# Patient Record
Sex: Female | Born: 1985 | Race: Black or African American | Hispanic: No | Marital: Single | Smoking: Current every day smoker
Health system: Southern US, Community
[De-identification: ages and names within clinical notes are randomized; demographics above are authoritative.]

## PROBLEM LIST (undated history)

## (undated) DIAGNOSIS — F1010 Alcohol abuse, uncomplicated: Secondary | ICD-10-CM

## (undated) HISTORY — PX: NO PAST SURGERIES: SHX2092

---

## 2010-08-15 ENCOUNTER — Encounter: Admission: RE | Admit: 2010-08-15 | Discharge: 2010-08-15 | Payer: Self-pay | Admitting: Infectious Diseases

## 2011-09-10 ENCOUNTER — Emergency Department (HOSPITAL_COMMUNITY): Payer: Self-pay

## 2011-09-10 ENCOUNTER — Emergency Department (HOSPITAL_COMMUNITY)
Admission: EM | Admit: 2011-09-10 | Discharge: 2011-09-10 | Disposition: A | Discharge status: Home / Self Care | Payer: Self-pay | Attending: Emergency Medicine | Admitting: Emergency Medicine

## 2011-09-10 DIAGNOSIS — R059 Cough, unspecified: Secondary | ICD-10-CM | POA: Insufficient documentation

## 2011-09-10 DIAGNOSIS — J4489 Other specified chronic obstructive pulmonary disease: Secondary | ICD-10-CM | POA: Insufficient documentation

## 2011-09-10 DIAGNOSIS — J449 Chronic obstructive pulmonary disease, unspecified: Secondary | ICD-10-CM | POA: Insufficient documentation

## 2011-09-10 DIAGNOSIS — R079 Chest pain, unspecified: Secondary | ICD-10-CM | POA: Insufficient documentation

## 2011-09-10 DIAGNOSIS — R0989 Other specified symptoms and signs involving the circulatory and respiratory systems: Secondary | ICD-10-CM | POA: Insufficient documentation

## 2011-09-10 DIAGNOSIS — R509 Fever, unspecified: Secondary | ICD-10-CM | POA: Insufficient documentation

## 2011-09-10 DIAGNOSIS — R0609 Other forms of dyspnea: Secondary | ICD-10-CM | POA: Insufficient documentation

## 2011-09-10 DIAGNOSIS — R05 Cough: Secondary | ICD-10-CM | POA: Insufficient documentation

## 2011-11-15 ENCOUNTER — Encounter: Payer: Self-pay | Admitting: Emergency Medicine

## 2011-11-15 ENCOUNTER — Emergency Department (HOSPITAL_COMMUNITY)
Admission: EM | Admit: 2011-11-15 | Discharge: 2011-11-16 | Disposition: A | Discharge status: Home / Self Care | Payer: Self-pay | Attending: Emergency Medicine | Admitting: Emergency Medicine

## 2011-11-15 ENCOUNTER — Emergency Department (HOSPITAL_COMMUNITY): Payer: Self-pay

## 2011-11-15 DIAGNOSIS — F17200 Nicotine dependence, unspecified, uncomplicated: Secondary | ICD-10-CM | POA: Insufficient documentation

## 2011-11-15 DIAGNOSIS — X58XXXA Exposure to other specified factors, initial encounter: Secondary | ICD-10-CM | POA: Insufficient documentation

## 2011-11-15 DIAGNOSIS — R42 Dizziness and giddiness: Secondary | ICD-10-CM | POA: Insufficient documentation

## 2011-11-15 DIAGNOSIS — S29011A Strain of muscle and tendon of front wall of thorax, initial encounter: Secondary | ICD-10-CM

## 2011-11-15 DIAGNOSIS — IMO0002 Reserved for concepts with insufficient information to code with codable children: Secondary | ICD-10-CM | POA: Insufficient documentation

## 2011-11-15 DIAGNOSIS — R079 Chest pain, unspecified: Secondary | ICD-10-CM | POA: Insufficient documentation

## 2011-11-15 NOTE — ED Notes (Signed)
Patient transported to X-ray 

## 2011-11-15 NOTE — ED Notes (Signed)
PT. REPORTS RIGHT ANTERIOR RIBCAGE PAIN WORSE WHEN COUGHING AND DEEP INSPIRATION  ONSET 2 DAYS AGO ,  DENIES FEVER OR CHILLS ,  NASAL CONGESTION .

## 2011-11-16 MED ORDER — IBUPROFEN 800 MG PO TABS
800.0000 mg | ORAL_TABLET | Freq: Once | ORAL | Status: AC
  Administered 2011-11-16: 800 mg via ORAL
  Filled 2011-11-16: qty 1

## 2011-11-16 MED ORDER — IBUPROFEN 600 MG PO TABS: 600.0000 mg | ORAL_TABLET | Freq: Three times a day (TID) | ORAL | Status: AC | PRN

## 2011-11-16 NOTE — ED Provider Notes (Signed)
History     CSN: [id number] Arrival date & time: 11/15/2011 11:14 PM   First MD Initiated Contact with Patient 11/16/11 0023      Chief Complaint  Patient presents with  . Chest Pain    (Consider location/radiation/quality/duration/timing/severity/associated sxs/prior treatment) HPI Comments: Patient reports right lower rib pain for the past 24 hours.  Does not recall any particular injury.  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  Does not have children in the home.  Has not been in an MVC sustained a large bearhug fallen down.  Travels.  Denies lower extremity swelling DVT risk factors.  Has not taken any over-the-counter medication to alleviate pain when she is lying still and breathing shallow.  Is not experiencing any pain when she takes a deep breath or coughs.  Pain is increased  Patient is a 25 y.o. female presenting with chest pain. The history is provided by the patient.  Chest Pain The chest pain began yesterday. Chest pain occurs constantly. The chest pain is unchanged. The pain is associated with coughing, breathing and exertion. At its most intense, the pain is at 4/10. The pain is currently at 3/10. The severity of the pain is mild. The quality of the pain is described as dull. The pain does not radiate. Chest pain is worsened by certain positions and deep breathing. Primary symptoms include dizziness. Pertinent negatives for primary symptoms include no fever, no shortness of breath, no cough, no wheezing, no abdominal pain, no nausea and no vomiting.  Dizziness does not occur with nausea, vomiting or weakness.   Pertinent negatives for associated symptoms include no weakness. She tried nothing for the symptoms. Risk factors include no known risk factors.     History reviewed. No pertinent past medical history.  History reviewed. No pertinent past surgical history.  No family history on  file.  History  Substance Use Topics  . Smoking status: Current Everyday Smoker  . Smokeless tobacco: Not on file  . Alcohol Use: Yes     ETOH THIS EVENING    OB History    Grav Para Term Preterm Abortions TAB SAB Ect Mult Living                  Review of Systems  Constitutional: Negative for fever.  HENT: Negative for congestion and rhinorrhea.   Eyes: Negative.   Respiratory: Negative for cough, shortness of breath and wheezing.   Cardiovascular: Positive for chest pain.  Gastrointestinal: Negative.  Negative for nausea, vomiting and abdominal pain.  Genitourinary: Negative for dysuria and frequency.  Musculoskeletal: Negative for myalgias and back pain.  Skin: Negative.  Negative for rash.  Neurological: Positive for dizziness. Negative for weakness.  Hematological: Negative.   Psychiatric/Behavioral: Negative.     Allergies  Review of patient's allergies indicates no known allergies.  Home Medications   Current Outpatient Rx  Name Route Sig Dispense Refill  . IBUPROFEN 600 MG PO TABS Oral Take 1 tablet (600 mg total) by mouth every 8 (eight) hours as needed for pain. 60 tablet 0    BP 117/82  Pulse 78  Temp(Src) 98.2 F (36.8 C) (Oral)  Resp 14  SpO2 100%  LMP 10/25/2011  Physical Exam  Constitutional: She is oriented to person, place, and time. She appears well-developed.  HENT:  Head: Normocephalic.  Eyes: Pupils are equal, round, and reactive to light.  Neck: Normal range  of motion.  Cardiovascular: Normal rate.   Pulmonary/Chest: Effort normal. She has no wheezes. She has no rales. She exhibits tenderness.       With deep palpitation lateral R chest wall tenderness  Abdominal: Soft.  Musculoskeletal: Normal range of motion.  Neurological: She is oriented to person, place, and time.  Skin: Skin is warm and dry.  Psychiatric: She has a normal mood and affect.    ED Course  Procedures (including critical care time)  Labs Reviewed - No data to  display Dg Chest 2 View  11/15/2011  *RADIOLOGY REPORT*  Clinical Data: Anterior right rib pain, just below the breast.  CHEST - 2 VIEW  Comparison: Chest radiograph performed 09/10/2011  Findings: No displaced rib fractures are seen.  The lungs are well-aerated and clear.  There is no evidence of focal opacification, pleural effusion or pneumothorax.  The heart is normal in size; the mediastinal contour is within normal limits.  No acute osseous abnormalities are seen.  IMPRESSION: No displaced rib fractures seen; no acute cardiopulmonary process identified.  Original Report Authenticated By: Tonia Ghent, M.D.     1. Chest wall muscle strain       MDM  With negative history for DVT risk factors denies cough, fever, trauma to the chest wall.  This is most likely a muscle strain.  We'll treat with ibuprofen on a regular basis for the next 2-3 weeks.  Informed patient if she starts running a fever and shortness of breath, return to the emergency room for further evaluation.  At this time.  Her chest x-ray is normal        Arman Filter, NP 11/16/11 0048  Arman Filter, NP 11/16/11 [phone]

## 2011-11-16 NOTE — ED Provider Notes (Signed)
Medical screening examination/treatment/procedure(s) were performed by non-physician practitioner and as supervising physician I was immediately available for consultation/collaboration.  Hurman Horn, MD 11/16/11 [phone]

## 2012-03-10 ENCOUNTER — Encounter (HOSPITAL_COMMUNITY): Payer: Self-pay

## 2012-03-10 ENCOUNTER — Emergency Department (INDEPENDENT_AMBULATORY_CARE_PROVIDER_SITE_OTHER)
Admission: EM | Admit: 2012-03-10 | Discharge: 2012-03-10 | Disposition: A | Discharge status: Home / Self Care | Payer: Self-pay | Source: Home / Self Care | Attending: Family Medicine | Admitting: Family Medicine

## 2012-03-10 DIAGNOSIS — J302 Other seasonal allergic rhinitis: Secondary | ICD-10-CM

## 2012-03-10 DIAGNOSIS — J309 Allergic rhinitis, unspecified: Secondary | ICD-10-CM

## 2012-03-10 MED ORDER — CETIRIZINE HCL 10 MG PO TABS: 10.0000 mg | ORAL_TABLET | Freq: Every day | ORAL | Status: DC

## 2012-03-10 MED ORDER — FLUTICASONE PROPIONATE 50 MCG/ACT NA SUSP: 2.0000 | Freq: Every day | NASAL | Status: DC

## 2012-03-10 NOTE — ED Provider Notes (Signed)
History     CSN: [id number]  Arrival date & time 03/10/12  1329   First MD Initiated Contact with Patient 03/10/12 1431      Chief Complaint  Patient presents with  . Influenza    (Consider location/radiation/quality/duration/timing/severity/associated sxs/prior treatment) Patient is a 26 y.o. female presenting with URI. The history is provided by the patient.  URI The primary symptoms include sore throat and cough. Primary symptoms do not include fever, wheezing, abdominal pain, nausea or vomiting. The current episode started yesterday. This is a new problem. The problem has not changed since onset. Symptoms associated with the illness include sinus pressure, congestion and rhinorrhea.    History reviewed. No pertinent past medical history.  History reviewed. No pertinent past surgical history.  History reviewed. No pertinent family history.  History  Substance Use Topics  . Smoking status: Current Everyday Smoker  . Smokeless tobacco: Not on file  . Alcohol Use: Yes     ETOH THIS EVENING    OB History    Grav Para Term Preterm Abortions TAB SAB Ect Mult Living                  Review of Systems  Constitutional: Negative.  Negative for fever.  HENT: Positive for congestion, sore throat, rhinorrhea, postnasal drip and sinus pressure.   Respiratory: Positive for cough. Negative for wheezing.   Gastrointestinal: Negative for nausea, vomiting and abdominal pain.    Allergies  Review of patient's allergies indicates no known allergies.  Home Medications   Current Outpatient Rx  Name Route Sig Dispense Refill  . CETIRIZINE HCL 10 MG PO TABS Oral Take 1 tablet (10 mg total) by mouth daily. One tab daily for allergies 30 tablet 1  . FLUTICASONE PROPIONATE 50 MCG/ACT NA SUSP Nasal Place 2 sprays into the nose daily. 1 g 2    BP 122/90  Pulse 86  Temp(Src) 99.2 F (37.3 C) (Oral)  Resp 14  SpO2 99%  LMP 02/28/2012  Physical Exam  Constitutional: She is  oriented to person, place, and time. She appears well-developed and well-nourished.  HENT:  Head: Normocephalic.  Right Ear: External ear normal.  Left Ear: External ear normal.  Nose: Mucosal edema and rhinorrhea present.  Mouth/Throat: Oropharynx is clear and moist.  Eyes: Conjunctivae are normal. Pupils are equal, round, and reactive to light.  Neck: Normal range of motion. Neck supple.  Cardiovascular: Normal rate, regular rhythm, normal heart sounds and intact distal pulses.   Pulmonary/Chest: Effort normal and breath sounds normal.  Lymphadenopathy:    She has no cervical adenopathy.  Neurological: She is alert and oriented to person, place, and time.  Skin: Skin is warm and dry.    ED Course  Procedures (including critical care time)  Labs Reviewed - No data to display No results found.   1. Seasonal allergic rhinitis       MDM         Linna Hoff, MD 03/10/12 [phone]

## 2012-03-10 NOTE — ED Notes (Signed)
C/o general body aches, cough, congestion, ear pain since Friday; taking no medicines for her symptoms

## 2013-12-13 ENCOUNTER — Emergency Department (HOSPITAL_COMMUNITY)
Admission: EM | Admit: 2013-12-13 | Discharge: 2013-12-13 | Disposition: A | Discharge status: Home / Self Care | Payer: Self-pay | Attending: Emergency Medicine | Admitting: Emergency Medicine

## 2013-12-13 ENCOUNTER — Encounter (HOSPITAL_COMMUNITY): Payer: Self-pay | Admitting: Emergency Medicine

## 2013-12-13 DIAGNOSIS — Z79899 Other long term (current) drug therapy: Secondary | ICD-10-CM | POA: Insufficient documentation

## 2013-12-13 DIAGNOSIS — F17200 Nicotine dependence, unspecified, uncomplicated: Secondary | ICD-10-CM | POA: Insufficient documentation

## 2013-12-13 DIAGNOSIS — H01003 Unspecified blepharitis right eye, unspecified eyelid: Secondary | ICD-10-CM

## 2013-12-13 DIAGNOSIS — IMO0002 Reserved for concepts with insufficient information to code with codable children: Secondary | ICD-10-CM | POA: Insufficient documentation

## 2013-12-13 DIAGNOSIS — H01009 Unspecified blepharitis unspecified eye, unspecified eyelid: Secondary | ICD-10-CM | POA: Insufficient documentation

## 2013-12-13 MED ORDER — BACITRACIN-POLYMYXIN B 500-10000 UNIT/GM OP OINT: 1.0000 "application " | TOPICAL_OINTMENT | OPHTHALMIC | Status: DC

## 2013-12-13 NOTE — Discharge Instructions (Signed)
Read the information below.  Use the prescribed medication as directed.  Please discuss all new medications with your pharmacist.  You may return to the Emergency Department at any time for worsening condition or any new symptoms that concern you.  If you have a change in vision, difficulty moving your eye, fevers, worsening discharge, see the eye doctor or return to the ER immediately.    Blepharitis Blepharitis is redness, soreness, and swelling (inflammation) of one or both eyelids. It may be caused by an allergic reaction or a bacterial infection. Blepharitis may also be associated with reddened, scaly skin (seborrhea) of the scalp and eyebrows. While you sleep, eye discharge may cause your eyelashes to stick together. Your eyelids may itch, burn, swell, and may lose their lashes. These will grow back. Your eyes may become sensitive. Blepharitis may recur and need repeated treatment. If this is the case, you may require further evaluation by an eye specialist (ophthalmologist). HOME CARE INSTRUCTIONS   Keep your hands clean.  Use a clean towel each time you dry your eyelids. Do not use this towel to clean other areas. Do not share a towel or makeup with anyone.  Wash your eyelids with warm water or warm water mixed with a small amount of baby shampoo. Do this twice a day or as often as needed.  Wash your face and eyebrows at least once a day.  Use warm compresses 2 times a day for 10 minutes at a time, or as directed by your caregiver.  Apply antibiotic ointment as directed by your caregiver.  Avoid rubbing your eyes.  Avoid wearing makeup until you get better.  Follow up with your caregiver as directed. SEEK IMMEDIATE MEDICAL CARE IF:   You have pain, redness, or swelling that gets worse or spreads to other parts of your face.  Your vision changes, or you have pain when looking at lights or moving objects.  You have a fever.  Your symptoms continue for longer than 2 to 4 days or  become worse. MAKE SURE YOU:   Understand these instructions.  Will watch your condition.  Will get help right away if you are not doing well or get worse. Document Released: 11/23/2000 Document Revised: 02/18/2012 Document Reviewed: 01/03/2011 [facility] Patient Information 2014 [place] [place].

## 2013-12-13 NOTE — ED Provider Notes (Signed)
CSN: [id number]     Arrival date & time 12/13/13  1347 History  This chart was scribed for Trixie Dredge, PA working with Doug Sou, MD by Quintella Reichert, ED Scribe. This patient was seen in room TR09C/TR09C and the patient's care was started at 3:27 PM.   Chief Complaint  Patient presents with  . Eye Pain    The history is provided by the patient. No language interpreter was used.    HPI Comments: Dana Matthews is a 28 y.o. female who presents to the Emergency Department complaining of constant, progressively-worsening, mild-to-moderate right eye pain that began on waking yesterday.  Pt also states that this morning her right eyelids were stuck shut with "crusty stuff."  She also states that the area feels itchy.  She denies pain on moving her eyes.  She denies visual changes, sore throat, cough, congestion, or any other associated symptoms.  She denies foreign bodies or injuries to her knowledge.  She admits to recent sick contact.     History reviewed. No pertinent past medical history.  History reviewed. No pertinent past surgical history.  History reviewed. No pertinent family history.   History  Substance Use Topics  . Smoking status: Current Every Day Smoker    Types: Cigarettes  . Smokeless tobacco: Not on file  . Alcohol Use: Yes    OB History   Grav Para Term Preterm Abortions TAB SAB Ect Mult Living                  Review of Systems  HENT: Negative for congestion, rhinorrhea, sinus pressure and sore throat.   Eyes: Positive for pain (eyelid), discharge and itching. Negative for visual disturbance.  Respiratory: Negative for cough.      Allergies  Review of patient's allergies indicates no known allergies.  Home Medications   Current Outpatient Rx  Name  Route  Sig  Dispense  Refill  . EXPIRED: cetirizine (ZYRTEC) 10 MG tablet   Oral   Take 1 tablet (10 mg total) by mouth daily. One tab daily for allergies   30 tablet   1   . EXPIRED:  fluticasone (FLONASE) 50 MCG/ACT nasal spray   Nasal   Place 2 sprays into the nose daily.   1 g   2    BP 110/74  Pulse 64  Temp(Src) 97.6 F (36.4 C) (Oral)  Resp 16  Wt 165 lb 8 oz (75.07 kg)  SpO2 100%  Physical Exam  Nursing note and vitals reviewed. Constitutional: She appears well-developed and well-nourished. No distress.  HENT:  Head: Normocephalic and atraumatic.  Mouth/Throat: Oropharynx is clear and moist. No oropharyngeal exudate, posterior oropharyngeal edema or posterior oropharyngeal erythema.  Frontal and maxillary sinuses nontender. No lymphadenopathy of head or neck.  Eyes: Conjunctivae and EOM are normal. Right conjunctiva is not injected. Right conjunctiva has no hemorrhage. Left conjunctiva is not injected. Left conjunctiva has no hemorrhage. No scleral icterus.  Right eyelid edematous and mildly erythematous.  Tender to palpation.  EOMs intact and without pain.  Conjunctivae and sclera clear.    Neck: Neck supple.  Pulmonary/Chest: Effort normal.  Lymphadenopathy:    She has no cervical adenopathy.  Neurological: She is alert.  Skin: She is not diaphoretic.    ED Course  Procedures (including critical care time)  DIAGNOSTIC STUDIES: Oxygen Saturation is 100% on room air, normal by my interpretation.    COORDINATION OF CARE: 3:32 PM-Discussed treatment plan which includes antibiotics with pt at bedside  and pt agreed to plan.    Labs Review Labs Reviewed - No data to display  Imaging Review No results found.  EKG Interpretation   None       MDM   1. Blepharitis, right    Patient with itching/ tenderness of right eye with discharge and matting of eyelids x 2 days.  She has mild edema and mild tenderness of right eyelid with a small area that is pink - this does not appears to be a preseptal cellulitis.  She has EOM intact without any pain.  Her right eye is not injected - does not have the appearance of conjunctivitis.  She has had no  trauma.  Doubt corneal abrasion or ulcer.  Likely mild right blepharitis.  D/C home with eye ointment.  Ophthalmology referral for worsening symptoms.  Discussed findings, treatment, and follow up  with patient.  Pt given return precautions.  Pt verbalizes understanding and agrees with plan.      I doubt any other EMC precluding discharge at this time including, but not necessarily limited to the following: orbital or periorbital cellulitis, corneal abrasion    I personally performed the services described in this documentation, which was scribed in my presence. The recorded information has been reviewed and is accurate.    HermanEmily Rae Plotner, PA-C 12/13/13 1654

## 2013-12-13 NOTE — ED Notes (Signed)
Pt states yesterday she woke with pain to R eye. Today she woke with more pain and drainage. States there was so much drainage she was unable to open her eyelids when she first woke up

## 2013-12-14 NOTE — ED Provider Notes (Signed)
Medical screening examination/treatment/procedure(s) were performed by non-physician practitioner and as supervising physician I was immediately available for consultation/collaboration.  EKG Interpretation   None        Doug SouSam Harriett Azar, MD 12/14/13 0028

## 2014-06-04 ENCOUNTER — Emergency Department (HOSPITAL_COMMUNITY): Payer: Worker's Compensation

## 2014-06-04 ENCOUNTER — Encounter (HOSPITAL_COMMUNITY): Payer: Self-pay | Admitting: Emergency Medicine

## 2014-06-04 ENCOUNTER — Emergency Department (HOSPITAL_COMMUNITY)
Admission: EM | Admit: 2014-06-04 | Discharge: 2014-06-04 | Disposition: A | Discharge status: Home / Self Care | Payer: Worker's Compensation | Attending: Emergency Medicine | Admitting: Emergency Medicine

## 2014-06-04 DIAGNOSIS — W268XXA Contact with other sharp object(s), not elsewhere classified, initial encounter: Secondary | ICD-10-CM | POA: Insufficient documentation

## 2014-06-04 DIAGNOSIS — S61209A Unspecified open wound of unspecified finger without damage to nail, initial encounter: Secondary | ICD-10-CM | POA: Insufficient documentation

## 2014-06-04 DIAGNOSIS — Y9289 Other specified places as the place of occurrence of the external cause: Secondary | ICD-10-CM | POA: Insufficient documentation

## 2014-06-04 DIAGNOSIS — S61219A Laceration without foreign body of unspecified finger without damage to nail, initial encounter: Secondary | ICD-10-CM

## 2014-06-04 DIAGNOSIS — Z7982 Long term (current) use of aspirin: Secondary | ICD-10-CM | POA: Insufficient documentation

## 2014-06-04 DIAGNOSIS — Y990 Civilian activity done for income or pay: Secondary | ICD-10-CM | POA: Insufficient documentation

## 2014-06-04 DIAGNOSIS — Y93G1 Activity, food preparation and clean up: Secondary | ICD-10-CM | POA: Insufficient documentation

## 2014-06-04 DIAGNOSIS — Z23 Encounter for immunization: Secondary | ICD-10-CM | POA: Insufficient documentation

## 2014-06-04 DIAGNOSIS — F17200 Nicotine dependence, unspecified, uncomplicated: Secondary | ICD-10-CM | POA: Insufficient documentation

## 2014-06-04 MED ORDER — CEPHALEXIN 500 MG PO CAPS: 500.0000 mg | ORAL_CAPSULE | Freq: Four times a day (QID) | ORAL | Status: DC

## 2014-06-04 MED ORDER — TETANUS-DIPHTH-ACELL PERTUSSIS 5-2.5-18.5 LF-MCG/0.5 IM SUSP
0.5000 mL | Freq: Once | INTRAMUSCULAR | Status: AC
Start: 2014-06-04 — End: 2014-06-04
  Administered 2014-06-04: 0.5 mL via INTRAMUSCULAR
  Filled 2014-06-04: qty 0.5

## 2014-06-04 MED ORDER — CEPHALEXIN 500 MG PO CAPS
500.0000 mg | ORAL_CAPSULE | Freq: Once | ORAL | Status: AC
  Administered 2014-06-04: 500 mg via ORAL
  Filled 2014-06-04: qty 1

## 2014-06-04 MED ORDER — TRAMADOL HCL 50 MG PO TABS
50.0000 mg | ORAL_TABLET | Freq: Once | ORAL | Status: AC
  Administered 2014-06-04: 50 mg via ORAL
  Filled 2014-06-04: qty 1

## 2014-06-04 MED ORDER — TRAMADOL HCL 50 MG PO TABS: 50.0000 mg | ORAL_TABLET | Freq: Four times a day (QID) | ORAL | Status: DC | PRN

## 2014-06-04 NOTE — Progress Notes (Signed)
[facility] provided pt with a list of primary care resources to help patient establish a pcp.  °

## 2014-06-04 NOTE — Discharge Instructions (Signed)
If you see signs of infection (warmth, redness, tenderness, pus, sharp increase in pain, fever, red streaking) immediately return to the emergency department.  Take your antibiotics as directed and to completion. You should never have any leftover antibiotics! Push fluids and stay well hydrated.   Please follow with your primary care doctor in the next 2 days for a check-up. They must obtain records for further management.   Do not hesitate to return to the Emergency Department for any new, worsening or concerning symptoms.

## 2014-06-04 NOTE — ED Notes (Signed)
Pt reports cutting her left middle finger on a piece of glass while at work. Bleeding is controlled at this time, vitals are WDL, and pt is in NAD.

## 2014-06-04 NOTE — ED Provider Notes (Signed)
CSN: [id number]     Arrival date & time 06/04/14  1554 History  This chart was scribed for Dana EmeryNicole Moriah Shawley, PA, working with Regan Lemmingob Beaton, MD, by Ardelia Memsylan Malpass ED Scribe. This patient was seen in room WTR7/WTR7 and the patient's care was started at 4:06 PM.   Chief Complaint  Patient presents with  . Laceration    The history is provided by the patient. No language interpreter was used.    HPI Comments: Dana Matthews is a right-hand dominant 28 y.o. female who presents to the Emergency Department complaining of laceration to the distal, palmar aspect of her left middle finger sustained earlier today. She states that she was cleaning and accidentally cut the finger on some broken glass. She is complaining of constant, moderate pain to the finger. Bleeding is controlled. She states that she is unsure of when she received her last Tetanus vaccination. She states that she did not sustain any other injuries today. She states that she has no medication allergies.     History reviewed. No pertinent past medical history. History reviewed. No pertinent past surgical history. No family history on file. History  Substance Use Topics  . Smoking status: Current Every Day Smoker    Types: Cigarettes  . Smokeless tobacco: Not on file  . Alcohol Use: Yes   OB History   Grav Para Term Preterm Abortions TAB SAB Ect Mult Living                 Review of Systems  Constitutional: Negative for fever.  Respiratory: Negative for shortness of breath.   Cardiovascular: Negative for chest pain.  Gastrointestinal: Negative for nausea, vomiting, abdominal pain and diarrhea.  All other systems reviewed and are negative.   Allergies  Review of patient's allergies indicates no known allergies.  Home Medications   Prior to Admission medications   Medication Sig Start Date End Date Taking? Authorizing Provider  aspirin 325 MG tablet Take 650 mg by mouth every 4 (four) hours as needed (pain).   Yes  Historical Provider, MD  cephALEXin (KEFLEX) 500 MG capsule Take 1 capsule (500 mg total) by mouth 4 (four) times daily. 06/04/14   Devanta Daniel, PA-C  traMADol (ULTRAM) 50 MG tablet Take 1 tablet (50 mg total) by mouth every 6 (six) hours as needed. 06/04/14   Wolfe Camarena, PA-C   Triage Vitals: BP 135/94  Pulse 76  Temp(Src) 98.4 F (36.9 C) (Oral)  Resp 18  SpO2 100%  LMP 06/04/2014  Physical Exam  Nursing note and vitals reviewed. Constitutional: She is oriented to person, place, and time. She appears well-developed and well-nourished. No distress.  HENT:  Head: Normocephalic.  Eyes: Conjunctivae and EOM are normal.  Cardiovascular: Normal rate.   Pulmonary/Chest: Effort normal. No stridor.  Musculoskeletal: Normal range of motion.  Neurological: She is alert and oriented to person, place, and time.  Skin:   1 cm full thickness laceration to the pad of the left middle finger. Bleeding controlled. NVI.  Psychiatric: She has a normal mood and affect.    ED Course  Procedures (including critical care time)  LACERATION REPAIR PROCEDURE NOTE The patient's identification was confirmed and consent was obtained. This procedure was performed by Dana EmeryNicole Jony Ladnier, PA at 5:35 PM. Site: pad of the left middle finger Sterile procedures observed Anesthetic used (type and amt): None Length: 1 cm Technique:Dermabond Tetanus vaccination: ordered Site anesthetized, irrigated with NS, explored without evidence of foreign body, wound well approximated, site covered with dry,  sterile dressing.  Patient tolerated procedure well without complications. Instructions for care discussed verbally and patient provided with additional written instructions for homecare and f/u.  DIAGNOSTIC STUDIES: Oxygen Saturation is 100% on RA, normal by my interpretation.    COORDINATION OF CARE: 4:11 PM- Pt advised of plan for treatment and pt agrees.  5:32 PM- Discussed radiology findings.  Performed laceration repair with Dermabond. Will start pt on Keflex and Tramadol and also update pt's Tetanus vaccination.  Labs Review Labs Reviewed - No data to display  Imaging Review Dg Finger Middle Left  06/04/2014   CLINICAL DATA:  Laceration.  EXAM: LEFT MIDDLE FINGER 2+V  COMPARISON:  None.  FINDINGS: There is no evidence of fracture or dislocation. There is no evidence of arthropathy or other focal bone abnormality. Soft tissues are unremarkable Common a subcutaneous gas or radiopaque foreign bodies.  IMPRESSION: Negative.   Electronically Signed   By: Awilda Metroourtnay  Bloomer   On: 06/04/2014 17:02    EKG Interpretation None      MDM   Final diagnoses:  Finger laceration, initial encounter    Filed Vitals:   06/04/14 1623 06/04/14 1749  BP: 135/94 117/81  Pulse: 76 63  Temp: 98.4 F (36.9 C) 98.3 F (36.8 C)  TempSrc: Oral Oral  Resp: 18 18  SpO2: 100% 100%    Medications  Tdap (BOOSTRIX) injection 0.5 mL (not administered)  traMADol (ULTRAM) tablet 50 mg (not administered)  cephALEXin (KEFLEX) capsule 500 mg (not administered)    Dana Matthews is a [age] y.o. female presenting with laceration to left middle finger, bleeding controlled, tetanus updated. X-ray shows no signs of foreign body. Patient was cleaning up at the time. I will start her on antibiotics. We have discussed return precautions for infection. We have discussed wound care for Dermabond.  Evaluation does not show pathology that would require ongoing emergent intervention or inpatient treatment. Pt is hemodynamically stable and mentating appropriately. Discussed findings and plan with patient/guardian, who agrees with care plan. All questions answered. Return precautions discussed and outpatient follow up given.   New Prescriptions   CEPHALEXIN (KEFLEX) 500 MG CAPSULE    Take 1 capsule (500 mg total) by mouth 4 (four) times daily.   TRAMADOL (ULTRAM) 50 MG TABLET    Take 1 tablet (50 mg total) by mouth  every 6 (six) hours as needed.    I personally performed the services described in this documentation, which was scribed in my presence. The recorded information has been reviewed and is accurate.   Dana Emeryicole Joseth Weigel, PA-C 06/04/14 1752

## 2014-06-06 NOTE — ED Provider Notes (Signed)
Medical screening examination/treatment/procedure(s) were performed by non-physician practitioner and as supervising physician I was immediately available for consultation/collaboration.   Robert L Beaton, MD 06/06/14 1305 

## 2014-12-10 NOTE — L&D Delivery Note (Signed)
Patient is 29 y.o. G1P1001 [id number] admitted for PROM. Had disjointed prenatal care [place] [facility] and had rupture of membranes in MAU.    Delivery Note At 6:05 PM a viable female was delivered via Vaginal, Spontaneous Delivery (Presentation: ; Occiput Anterior).  APGAR: 9, 9; weight -pending .   Placenta status: Intact, Spontaneous.  Cord: 3 vessels with the following complications: None.  Cord pH: not collected  Anesthesia: Epidural  Episiotomy: None Lacerations: 2nd degree Suture Repair: 3.0 Est. Blood Loss (mL): 150  Mom to postpartum.  Baby to Couplet care / Skin to Skin.  Isa RankinKimberly Niles [facility] 12/01/2015, 7:02 PM

## 2015-04-06 ENCOUNTER — Encounter (HOSPITAL_COMMUNITY): Payer: Self-pay | Admitting: Emergency Medicine

## 2015-04-06 ENCOUNTER — Emergency Department (HOSPITAL_COMMUNITY)
Admission: EM | Admit: 2015-04-06 | Discharge: 2015-04-07 | Disposition: A | Discharge status: Home / Self Care | Payer: Self-pay | Attending: Emergency Medicine | Admitting: Emergency Medicine

## 2015-04-06 DIAGNOSIS — R110 Nausea: Secondary | ICD-10-CM | POA: Insufficient documentation

## 2015-04-06 DIAGNOSIS — F17210 Nicotine dependence, cigarettes, uncomplicated: Secondary | ICD-10-CM | POA: Insufficient documentation

## 2015-04-06 DIAGNOSIS — O9989 Other specified diseases and conditions complicating pregnancy, childbirth and the puerperium: Secondary | ICD-10-CM | POA: Insufficient documentation

## 2015-04-06 DIAGNOSIS — O99311 Alcohol use complicating pregnancy, first trimester: Secondary | ICD-10-CM | POA: Insufficient documentation

## 2015-04-06 DIAGNOSIS — Z3A01 Less than 8 weeks gestation of pregnancy: Secondary | ICD-10-CM | POA: Insufficient documentation

## 2015-04-06 DIAGNOSIS — Z3490 Encounter for supervision of normal pregnancy, unspecified, unspecified trimester: Secondary | ICD-10-CM

## 2015-04-06 DIAGNOSIS — Z7982 Long term (current) use of aspirin: Secondary | ICD-10-CM | POA: Insufficient documentation

## 2015-04-06 DIAGNOSIS — O99331 Smoking (tobacco) complicating pregnancy, first trimester: Secondary | ICD-10-CM | POA: Insufficient documentation

## 2015-04-06 DIAGNOSIS — F1010 Alcohol abuse, uncomplicated: Secondary | ICD-10-CM

## 2015-04-06 NOTE — ED Notes (Signed)
Pt. requesting detox for her alcohol abuse , last drink today ( 2 - 40 oz. Beer) , denies suicidal ideation / alert and oriented.

## 2015-04-06 NOTE — ED Notes (Signed)
TTS machine at bedside. 

## 2015-04-06 NOTE — ED Provider Notes (Signed)
CSN: [id number]     Arrival date & time 04/06/15  [id number] History  This chart was scribed for non-physician practitioner, Harle BattiestElizabeth Karizma Cheek, NP, working with Tomasita CrumbleAdeleke Oni, MD, by Bronson CurbJacqueline Melvin, ED Scribe. This patient was seen in room TR09C/TR09C and the patient's care was started at 11:20 PM.   Chief Complaint  Patient presents with  . Alcohol Problem    The history is provided by the patient. No language interpreter was used.     HPI Comments: Dana Matthews is a 29 y.o. female who presents to the Emergency Department requesting EtOH detoxication. Patient states she has been drinking two 40oz daily for the past 5 years. She is currently [redacted] weeks pregnant and states she is currently at a place where she feels that she can stop drinking. She states her last drink was today (two 40oz). She reports having periods of black outs, the last episode was "awhile ago". She denies seizures or any symptoms of withdrawals, stating there has never been a time where she stopped drinking. She denies SI/HI. Patient also mentions she has been having nausea, however, she believes this is pregnancy related. She denies vaginal bleeding or vaginal discharge. LNMP February 22, 2015. Patient is a current 0.5-1 ppd smoker and denies any recreational drug use.    History reviewed. No pertinent past medical history. History reviewed. No pertinent past surgical history. No family history on file. History  Substance Use Topics  . Smoking status: Current Every Day Smoker    Types: Cigarettes  . Smokeless tobacco: Not on file  . Alcohol Use: Yes   OB History    No data available     Review of Systems  Gastrointestinal: Positive for nausea.  Genitourinary: Negative for vaginal bleeding and vaginal discharge.  Neurological: Negative for seizures.  Psychiatric/Behavioral: Negative for suicidal ideas and agitation.  All other systems reviewed and are negative.     Allergies  Review of patient's allergies  indicates no known allergies.  Home Medications   Prior to Admission medications   Medication Sig Start Date End Date Taking? Authorizing Provider  aspirin 325 MG tablet Take 650 mg by mouth every 4 (four) hours as needed (pain).   Yes Historical Provider, MD  cephALEXin (KEFLEX) 500 MG capsule Take 1 capsule (500 mg total) by mouth 4 (four) times daily. Patient not taking: Reported on 04/06/2015 06/04/14   Joni ReiningNicole Pisciotta, PA-C  traMADol (ULTRAM) 50 MG tablet Take 1 tablet (50 mg total) by mouth every 6 (six) hours as needed. Patient not taking: Reported on 04/06/2015 06/04/14   Wynetta EmeryNicole Pisciotta, PA-C   Triage Vitals: BP 134/58 mmHg  Pulse 74  Temp(Src) 98.4 F (36.9 C) (Oral)  Resp 16  Ht 5\' 4"  (1.626 m)  Wt 158 lb (71.668 kg)  BMI 27.11 kg/m2  SpO2 98%  LMP 02/22/2015  Physical Exam  Constitutional: She is oriented to person, place, and time. She appears well-developed and well-nourished. No distress.  HENT:  Head: Normocephalic and atraumatic.  Mouth/Throat: Oropharynx is clear and moist.  Eyes: Conjunctivae and EOM are normal.  Neck: Neck supple. No tracheal deviation present. No thyromegaly present.  Cardiovascular: Normal rate, regular rhythm and intact distal pulses.   Pulmonary/Chest: Effort normal and breath sounds normal. No respiratory distress. She has no wheezes. She has no rales. She exhibits no tenderness.  Abdominal: Soft. She exhibits no distension and no mass. There is no tenderness. There is no rebound and no guarding.  Musculoskeletal: Normal range of motion. She exhibits  no tenderness.  Lymphadenopathy:    She has no cervical adenopathy.  Neurological: She is alert and oriented to person, place, and time.  Skin: Skin is warm and dry. No rash noted. She is not diaphoretic.  Psychiatric: She has a normal mood and affect. Her behavior is normal.  Nursing note and vitals reviewed.   ED Course  Procedures (including critical care time)  DIAGNOSTIC  STUDIES: Oxygen Saturation is 98% on room air, normal by my interpretation.    COORDINATION OF CARE: At 2333 Discussed treatment plan with patient. Patient agrees.   Labs Review Labs Reviewed  CBC WITH DIFFERENTIAL/PLATELET - Abnormal; Notable for the following:    HCT 35.4 (*)    All other components within normal limits  COMPREHENSIVE METABOLIC PANEL - Abnormal; Notable for the following:    Glucose, Bld 114 (*)    All other components within normal limits  ETHANOL - Abnormal; Notable for the following:    Alcohol, Ethyl (B) 69 (*)    All other components within normal limits  URINALYSIS, ROUTINE W REFLEX MICROSCOPIC - Abnormal; Notable for the following:    APPearance CLOUDY (*)    Hgb urine dipstick TRACE (*)    Ketones, ur 15 (*)    Protein, ur 30 (*)    All other components within normal limits  ACETAMINOPHEN LEVEL - Abnormal; Notable for the following:    Acetaminophen (Tylenol), Serum <10.0 (*)    All other components within normal limits  URINE MICROSCOPIC-ADD ON - Abnormal; Notable for the following:    Squamous Epithelial / LPF FEW (*)    Bacteria, UA FEW (*)    Crystals CA OXALATE CRYSTALS (*)    All other components within normal limits  POC URINE PREG, ED - Abnormal; Notable for the following:    Preg Test, Ur POSITIVE (*)    All other components within normal limits  URINE RAPID DRUG SCREEN (HOSP PERFORMED)  SALICYLATE LEVEL    Imaging Review No results found.   EKG Interpretation None      MDM   Final diagnoses:  Alcohol abuse  Pregnancy   29 yo with request for detox from ETOH and [redacted] weeks pregnant. Her last alcohol intake was 7:30 pm tonight. Discussed case with Dr. Mora Bellman. She has been medically cleared in the ED and is awaiting consult by TTS for possible placement vs OP clinic information. Pt is currently not having SI or HI and appears stable in NAD. Pt is cleared to be moved back to [facility].   1:30 AM: TTS counselor discussed her recommendations  including outpatient treatment for alcohol abuse.  Discussed with psych PA, meds for withdrawal symptoms not necessary for pts with only moderate intake similar to this pt's.   Discussed with pt all lab results and resources provided for behavioral health and [facility] to initiate prenatal care. Pt is well-appearing, in no acute distress and vital signs reviewed and  not concerning. She has no signs or symptoms of withdrawal. She appears safe to be discharged.  Return precautions provided. At discharge, pt reports she assumed she would be admitted and she has no where to live as she was kicked out of her father's house. Discussed case with Charge RN and pt can wait in C pod until Child psychotherapist arrives in the am. Pt aware of plan and in agreement.  I personally performed the services described in this documentation, which was scribed in my presence. The recorded information has been reviewed  and is accurate.  Filed Vitals:   04/06/15 1940  BP: 134/58  Pulse: 74  Temp: 98.4 F (36.9 C)  TempSrc: Oral  Resp: 16  Height:  (1.626 m)  Weight: 158 lb (71.668 kg)  SpO2: 98%   Meds given in ED:  Medications - No data to display  Discharge Medication List as of 04/07/2015  1:43 AM        Harle Battiest, NP 04/07/15 1610  Tomasita Crumble, MD 04/08/15 [phone]

## 2015-04-07 LAB — URINE MICROSCOPIC-ADD ON

## 2015-04-07 LAB — COMPREHENSIVE METABOLIC PANEL
ALT: 13 U/L (ref 0–35)
AST: 19 U/L (ref 0–37)
Albumin: 3.8 g/dL (ref 3.5–5.2)
Alkaline Phosphatase: 64 U/L (ref 39–117)
Anion gap: 12 (ref 5–15)
BUN: 6 mg/dL (ref 6–23)
CALCIUM: 9.3 mg/dL (ref 8.4–10.5)
CO2: 20 mmol/L (ref 19–32)
CREATININE: 0.62 mg/dL (ref 0.50–1.10)
Chloride: 106 mmol/L (ref 96–112)
Glucose, Bld: 114 mg/dL — ABNORMAL HIGH (ref 70–99)
Potassium: 3.8 mmol/L (ref 3.5–5.1)
SODIUM: 138 mmol/L (ref 135–145)
TOTAL PROTEIN: 7.1 g/dL (ref 6.0–8.3)
Total Bilirubin: 0.3 mg/dL (ref 0.3–1.2)

## 2015-04-07 LAB — RAPID URINE DRUG SCREEN, HOSP PERFORMED
AMPHETAMINES: NOT DETECTED
BENZODIAZEPINES: NOT DETECTED
Barbiturates: NOT DETECTED
COCAINE: NOT DETECTED
Opiates: NOT DETECTED
Tetrahydrocannabinol: NOT DETECTED

## 2015-04-07 LAB — URINALYSIS, ROUTINE W REFLEX MICROSCOPIC
Bilirubin Urine: NEGATIVE
GLUCOSE, UA: NEGATIVE mg/dL
Ketones, ur: 15 mg/dL — AB
LEUKOCYTES UA: NEGATIVE
Nitrite: NEGATIVE
PROTEIN: 30 mg/dL — AB
SPECIFIC GRAVITY, URINE: 1.027 (ref 1.005–1.030)
UROBILINOGEN UA: 0.2 mg/dL (ref 0.0–1.0)
pH: 5 (ref 5.0–8.0)

## 2015-04-07 LAB — CBC WITH DIFFERENTIAL/PLATELET
BASOS PCT: 1 % (ref 0–1)
Basophils Absolute: 0.1 10*3/uL (ref 0.0–0.1)
EOS ABS: 0.3 10*3/uL (ref 0.0–0.7)
EOS PCT: 3 % (ref 0–5)
HEMATOCRIT: 35.4 % — AB (ref 36.0–46.0)
HEMOGLOBIN: 12.2 g/dL (ref 12.0–15.0)
LYMPHS ABS: 3 10*3/uL (ref 0.7–4.0)
Lymphocytes Relative: 33 % (ref 12–46)
MCH: 31.2 pg (ref 26.0–34.0)
MCHC: 34.5 g/dL (ref 30.0–36.0)
MCV: 90.5 fL (ref 78.0–100.0)
MONO ABS: 0.6 10*3/uL (ref 0.1–1.0)
MONOS PCT: 6 % (ref 3–12)
NEUTROS PCT: 57 % (ref 43–77)
Neutro Abs: 5.1 10*3/uL (ref 1.7–7.7)
Platelets: 304 10*3/uL (ref 150–400)
RBC: 3.91 MIL/uL (ref 3.87–5.11)
RDW: 13.2 % (ref 11.5–15.5)
WBC: 9 10*3/uL (ref 4.0–10.5)

## 2015-04-07 LAB — ETHANOL: Alcohol, Ethyl (B): 69 mg/dL — ABNORMAL HIGH (ref 0–9)

## 2015-04-07 LAB — SALICYLATE LEVEL

## 2015-04-07 LAB — POC URINE PREG, ED: Preg Test, Ur: POSITIVE — AB

## 2015-04-07 LAB — ACETAMINOPHEN LEVEL

## 2015-04-07 NOTE — ED Notes (Signed)
Pt changed into paper scrubs.

## 2015-04-07 NOTE — Clinical Social Work Note (Addendum)
Clinical Social Work Assessment  Patient Details  Name: Dana Matthews MRN: [id number] Date of Birth: 1986/01/29  Date of referral:  04/07/15               Reason for consult:  Housing Concerns/Homelessness, Substance Use/ETOH Abuse, Other (Comment Required) (recently found out she is pregnant)                Permission sought to share information with:  Other (none) Permission granted to share information::  No  Name::        Agency::  Patient is agreeable at this time for referrals to be sent out and complete phone interviews for placement options  Relationship::  none  Contact Information:  none  Housing/Transportation Living arrangements for the past 2 months:  Apartment (living with father's girlfriend) Source of Information:  Patient Patient Interpreter Needed:  None Criminal Activity/Legal Involvement Pertinent to Current Situation/Hospitalization:  No - Comment as needed Significant Relationships:  None Lives with:  Friends (currently homeless) Do you feel safe going back to the place where you live?  No Need for family participation in patient care:  No (Coment) (patient will not allow at this time)  Care giving concerns:  Patient reports she recently has been living at her father's home with his girlfriend. Girlfriend will not allow patient back as patient is guarded about what happended.  Patient reports she has been staying with random people that are not positive influences to her life. Patient also recently found out she was pregnant possibly 6 weeks.  Patient has no prenatal care and is abusing alcohol and tobacco per report.  Patient at this time does not have a safe DC plan as she reports she is currently homeless.   Social Worker assessment / plan:  LCSW met with patient one-one and discussed her current situation. Patient reports she has been drinking/self medicating with alcohol for years due to anxiety and depression.  Patient reports drinking 2 40's daily and  smoking cigarettes.  Patient reports she has never been homeless, had previous jobs, and completed high school, but as of last night she had a "falling out with father's girlfriend" and cannot return.  Patient does not know who the father is of her baby, reports she is going to keep the baby and wants to stop drinking and smoking. LCSW discussed options of shelters, outpatient SA, and inpatient perinatal treatment facilities. Patient agreeable to perinatal treatment and patient completed phone interview with [facility] in [place].  There is a wait list, however her case will be staffed and LCSW will be informed of outcome.  Other options are [facility] at the [facility], [facility], [facility], and [facility].  LCSW staffed case with [facility] who is in charge of the [facility] who reports her orange card is active.  Patient is also eligible for transportation in which [facility] going to help arrange for [facility].  [facility] called [facility] to help with [facility]'s perinatal care  At the clinic.  Patient agreeable to care plan and at this time very immature acting, but cooperative and engaged.   Plan:  Awaiting return calls for placement options at [facility] the [facility] and [facility].   [facility] to call LCSW back once case is staffed to see if she is on the wait list.   Patient given clinic appointment to follow up with prenatal care. Patient aware of [facility] for crisis needs if they arise. Patient given information about [facility] for additional  needs. Transportation arranged for patient in the community through [facility].  Medicaid Application given to patient Track Phone application also given to patient to apply once she has medicaid.  Employment status:  Unemployed (has worked in the past: Architectural technologist) Insurance information:  [employer], Self Pay (Medicaid Pending) PT Recommendations:  Not assessed at this time Information / Referral to community resources:  Outpatient Psychiatric Care (Comment Required),  Residential Substance Abuse Treatment Options, Other (Comment Required) (Perinatal and Maternal Substance Abuse Facilities)  Patient/Family's Response to care:  Patient is very immature acting for her age, but agreeable to plan.  She laughs when uncomfortable and does not make direct eye contact as she reports she is embarrassed.    Patient/Family's Understanding of and Emotional Response to Diagnosis, Current Treatment, and Prognosis:    Patient able to verbalize understanding of ceasing all SA use during pregnancy. LCSW gave information about pregnancy options including terminating pregnancy if she so chooses.  Patient smiles and is cooperative, however appears to not understand how pregnancy is going to affect her life and choices.  She verbalizes interest in treatment as she has no supports in the area and at this time wants to continue the pregnancy.  Patient appreciative of help and information.   Emotional Assessment Appearance:  Appears younger than stated age, Disheveled Attitude/Demeanor/Rapport:  Avoidant, Inconsistent (Patient very giggly and embarrassed during assessment) Affect (typically observed):  Anxious Orientation:  Oriented to Self, Oriented to Place, Oriented to  Time, Oriented to Situation Alcohol / Substance use:  Alcohol Use, Tobacco Use Psych involvement (Current and /or in the community):  Yes (Comment) (Cleared by Psych )  Discharge Needs  Concerns to be addressed:  Childcare Concerns, Discharge Planning Concerns, Homelessness, Lack of Support, Substance Abuse Concerns Readmission within the last 30 days:  No Current discharge risk:  Substance Abuse, Homeless (Pregnant no preinatal care) Barriers to Discharge:  Active Substance Use, Family Issues, Homeless with medical needs, Unsafe home situation  Referral completed for [facility] at the [facility]: Residential Director to call LCSW back with more information about admission. Working with [facility] in efforts to have patient seen at  [facility] for screening to [employer]  [place], LCSW 04/07/2015, 9:01 AM

## 2015-04-07 NOTE — Discharge Instructions (Signed)
Please follow the directions provided. Use the referral or the resource guide to contact [facility] further management of your alcohol detox. Use the women's outpatient clinic to establish prenatal care as soon as possible. Don't hesitate to return for any new, worsening, or concerning symptoms.  SEEK IMMEDIATE MEDICAL CARE IF:  You have a seizure.  You have a fever.  You experience uncontrolled vomiting or you vomit up blood. This may be bright red or look like black coffee grounds.  You have blood in the stool. This may be bright red or appear as a black, tarry, bad-smelling stool.  You become lightheaded or faint. Do not drive if you feel this way. Have someone else drive you or call [phone] for help.  You become more agitated or confused.  You develop uncontrolled anxiety.  You begin to see things that are not really there (hallucinate).    Emergency Department Resource Guide 1) Find a Doctor and Pay Out of Pocket Although you won't have to find out who is covered by your insurance plan, it is a good idea to ask around and get recommendations. You will then need to call the office and see if the doctor you have chosen will accept you as a new patient and what types of options they offer for patients who are self-pay. Some doctors offer discounts or will set up payment plans for their patients who do not have insurance, but you will need to ask so you aren't surprised when you get to your appointment.  2) Contact Your Local Health Department Not all health departments have doctors that can see patients for sick visits, but many do, so it is worth a call to see if yours does. If you don't know where your local health department is, you can check in your phone book. The CDC also has a tool to help you locate your state's health department, and many state websites also have listings of all of their local health departments.  3) Find a Walk-in Clinic If your illness is not likely to  be very severe or complicated, you may want to try a walk in clinic. These are popping up all over the country in pharmacies, drugstores, and shopping centers. They're usually staffed by nurse practitioners or physician assistants that have been trained to treat common illnesses and complaints. They're usually fairly quick and inexpensive. However, if you have serious medical issues or chronic medical problems, these are probably not your best option.  No Primary Care Doctor: - Call Health Connect at  [phone] - they can help you locate a primary care doctor that  accepts your insurance, provides certain services, etc. - Physician Referral Service- [phone]  Chronic Pain Problems: Organization         Address  Phone   Notes  [facility]  [phone] Patients need to be referred by their primary care doctor.   Medication Assistance: Organization         Address  Phone   Notes  [facility] [place] Medication [facility] [address] [place], [place] [id number] [phone] --Must be a resident of [facility] -- Must have NO insurance coverage whatsoever (no Medicaid/ Medicare, etc.) -- The pt. MUST have a primary care doctor that directs their care regularly and follows them in the community   MedAssist  [phone]   [employer]  [phone]    Agencies that provide inexpensive medical care: Organization  Address  Phone   Notes  [place]  [phone]   [facility]    [phone]   [facility] [place], [place] [address] [phone]   [place] [address] [place]. [address] [place] [phone]   Planned Parenthood    [phone]   [facility]    [phone]   [place] [address], [place] Phone:  [phone], Fax:  [phone] Hours of Operation:  9 am - 6 pm, M-F.  Also  accepts Medicaid/Medicare and self-pay.  [facility] for [address], [address], [place] Phone: [phone], Fax: [phone]. Hours of Operation:  8:30 am - 5:30 pm, M-F.  Also accepts Medicaid and self-pay.  [facility] [address], [place] Phone: [phone]   [place], [place], [place] [phone], Ext. [phone] Mondays & Thursdays: 7-9 AM.  First 15 patients are seen on a first come, first serve basis.    [place] Providers:  Organization         Address  Phone   Notes  [facility] [address], [place] [phone] Also accepts self-pay patients.  [facility] [address], [place]  [phone]   [place], [address], [place] [phone]   [facility] [address] [place] [phone]   Lucianne Lei [address], [place]   [phone] Only accepts [place] Access [place] patients after they have their name applied to their card.   Self-Pay (no insurance) in [facility]:  Organization         Address  Phone   Notes  Sickle Cell Patients, [facility] [place] [phone]   [facility] Care [place] [phone]   [facility] [place]  [place], [place], [place] [phone]   [facility]/Dr. Osei-Bonsu  [address], [place] or [address], [place] [phone] Phone number for both [facility] [place] and [place] locations is the same.  [facility] [address] [place] [phone]   [facility] [address], [place] or [address] [phone]   [facility] [address]  [place] [phone], phone; [phone], fax Sees patients 1st and 3rd Saturday of every month.  Must not qualify for public or private insurance (i.e. Medicaid, Medicare, [place] Health Choice, Veterans' Benefits)  Household income should be no more than 200% of the poverty level The clinic cannot treat you if you are pregnant or think you are pregnant  Sexually transmitted diseases are not treated at the clinic.    Dental Care: Organization         Address  Phone  Notes  [facility] Department of [facility] [phone] Accepts children up to age 33 who are enrolled in [place] or [place]; pregnant women with a Medicaid card; and children who have applied for Medicaid or [place] Health Choice, but were declined, whose parents can pay a reduced fee at time  of service.  [facility] Department of [facility]  [address], [place] [phone] Accepts children up to age 84 who are enrolled in [place] or [place]; pregnant women with a Medicaid card; and children who have applied for Medicaid or [place] Health Choice, but were declined, whose parents can pay a reduced fee at time of service.  [place] Adult Dental Access PROGRAM  [place] [phone] Patients are seen by appointment only. Walk-ins are not accepted. [place] will see patients 65 years of age and older. Monday - Tuesday (8am-5pm) Most Wednesdays (8:30-5pm) $30 per visit, cash only  [facility] Adult Dental Access PROGRAM  [address], [facility] [phone] Patients are seen by appointment only. Walk-ins are not accepted. [place] will see patients 57 years of age and older. One Wednesday Evening (Monthly: Volunteer Based).  $30 per visit, cash only  [place]  [phone] for adults; Children under age 85, call Graduate Pediatric Dentistry at [phone].  Children aged 75-14, please call [phone] to request a pediatric application.  Dental services are provided in all areas of dental care including fillings, crowns and bridges, complete and partial dentures, implants, gum treatment, root canals, and extractions. Preventive care is also provided. Treatment is provided to both adults and children. Patients are selected via a lottery and there is often a waiting list.   [facility] [address] [place]  [phone] [email or web address]   [facility] [address] [place], [place] [phone], Ext. [phone] Second and Fourth Thursday of each month, opens at 6:30 AM; Clinic ends at 9 AM.  Patients are seen on a first-come first-served basis, and a limited number are seen during each clinic.   [facility]  [address] Hillard Danker [place], [place] [phone]   Eligibility Requirements You must have lived in [place], [place], or [place] counties for at least the last three months.   You cannot be eligible for state or federal sponsored [employer], including [employer], [place], or [employer].   You generally cannot be eligible for healthcare insurance through your employer.    How to apply: Eligibility screenings are held every Tuesday and Wednesday afternoon from 1:00 pm until 4:00 pm. You do not need an appointment for the interview!  [facility] [address] [place], [place]   [place]  [place] Department  [place]  [phone]    Behavioral Health Resources in the Community: Intensive Outpatient Programs Organization         Address  Phone  Notes  [place]. [address], [place], [place] [phone]   [facility]patient [address], [place], [place]   ADS: Alcohol & Drug Svcs [address], [place], [place]   [place] [address]  [place], [place] or [phone]   Substance Abuse Resources Organization         Address  Phone  Notes  Alcohol and Drug Services  [phone]   [place]  [phone]   The [place]   Chinita Pester  [phone]   Residential & Outpatient Substance Abuse Program  [phone]   Psychological Services Organization         Address  Phone  Notes  [facility]  [place]  [phone]   [facility] [address], [place] or [phone]    Mobile Crisis Teams Organization         Address  Phone  Notes  [facility], Mobile Crisis Care Unit  [phone]   [facility]  [address] [place], [place]   [place] Levels [address] [place] [phone]    Self-Help/Support Groups Organization         Address  Phone             Notes  [place]. of [place] - variety of support groups  [place] Call for more information  Narcotics Anonymous (NA), [facility] [address], [employer] [place]  2 meetings at this location   Special educational needs teacher         Address  Phone  Notes  [facility] [place],    [place]  [phone]   [facility]  [address], [place] [phone], [place], [place]   [place], [place] [phone] Admissions: 8am-3pm M-F  Incentives Substance [place] [address],    [place], [place] [phone]   [facility] [address] [place], [place], [place]   [facility] [address],  [place], [place]   [facility]ight Programs - Intensive Outpatient [address] Kristeen [address], [place], [place]     [facility] ([place].) [place].,  [place], [place] [phone] or [phone]   [facility] Services (RTS) [address], [place], [place] Accepts Medicaid  [facility] [place] [address]  [place] [phone] Substance Abuse/Addiction Treatment   [facility] Organization         Address  Phone  Notes  [facility]  [phone]   Domenic Schwab, PhD [address] Arlis Port[place], [place]   [phone] or [phone]   [place]ie [place], [place] [phone]   [facility] [address] [place], [place] [phone] Insurance/Medicaid/sponsorship through [facility] and Families [address] [place]                                    [place], [place] [phone] [place] [address], [place] [phone]    Dr. Adele Schilder  [phone]   [facility] Dept. 1) [address] [place] 2) [place] 3)  [place] [address], [place] [phone]  [phone]   [place] [phone] or [phone] (After Hours)

## 2015-04-07 NOTE — ED Notes (Signed)
Social work has arrived and will see pt

## 2015-04-07 NOTE — BH Assessment (Addendum)
Tele Assessment Note   Dana Matthews is an 29 y.o. female who came into the [facility] requesting alcohol detox and rehab.  Pt reports that she is homeless and drinking "a lot of alcohol every day." Pt reported she just recently found out she was pregnant.  Pt states she her pregnancy is unexpected and upsetting.  Pt denies SI, HI, SH urges or AVH.  Pt denies any kind of abuse currently or in her past. Pt denies that she has ever attempted suicide or has ever harmed anyone else. Pt does state that she has anger outbursts when she is drinking and she stated "I'm drinking everyday." Pt stated that her father's side of her family is supportive but her mother's side is not. Pt reported never having been IP for MH reasons and never having OPT.  Pt has symptoms of depression including sadness, low self-esteem, tearfulness, loss of interest in activities, feelings of helplessness and hopelessness.  Pt described her daily alcohol consumption as drinking "2-3 40s a day" meaning 2-3 40 oz beers.  Pt stated she had been drinking at that level daily for 5 years. ETOH was tested to be 69.   Pt was dressed in street clothes which were unremarkable.  Pt kept good eye contact, moved normally and spoke in a clear voice, enunciating well..  Pt's thought processes were coherent and relevant. Pt's mood was depressed and her constricted affect was congruent.  Pt was oriented x 4.   Axis I: 311 Unspecified Depressive Disorder Axis II: Deferred Axis III: History reviewed. No pertinent past medical history. Axis IV: economic problems, housing problems, occupational problems, other psychosocial or environmental problems, problems related to social environment and problems with primary support group Axis V: 51-60 moderate symptoms  Past Medical History: History reviewed. No pertinent past medical history.  History reviewed. No pertinent past surgical history.  Family History: No family history on file.  Social History:   reports that she has been smoking Cigarettes.  She does not have any smokeless tobacco history on file. She reports that she drinks alcohol. She reports that she does not use illicit drugs.  Additional Social History:  Alcohol / Drug Use Prescriptions: See PTA list History of alcohol / drug use?: Yes Longest period of sobriety (when/how long): a few weeks Negative Consequences of Use: Financial, Personal relationships, Work / School Substance #1 Name of Substance 1: Alcohol 1 - Age of First Use: 22 1 - Amount (size/oz): 2-3 40 oz beers a day 1 - Frequency: daily 1 - Duration: 5 years 1 - Last Use / Amount: today  CIWA: CIWA-Ar BP: 134/58 mmHg Pulse Rate: 74 Nausea and Vomiting: mild nausea with no vomiting Tactile Disturbances: none Tremor: no tremor Auditory Disturbances: not present Paroxysmal Sweats: no sweat visible Visual Disturbances: not present Anxiety: no anxiety, at ease Headache, Fullness in Head: none present Agitation: normal activity Orientation and Clouding of Sensorium: oriented and can do serial additions CIWA-Ar Total: 1 COWS:    PATIENT STRENGTHS: (choose at least two) Ability for insight Capable of independent living Communication skills Supportive family/friends  Allergies: No Known Allergies  Home Medications:  (Not in a hospital admission)  OB/GYN Status:  Patient's last menstrual period was 02/22/2015.  General Assessment Data [facility] ED Is this a Tele or Face-to-Face Assessment?: Tele Assessment Is this an Initial Assessment or a Re-assessment for this encounter?: Initial Assessment Living Arrangements: Other (Comment) (Homeless) Can pt return to current living arrangement?: Yes Admission Status: Voluntary Is  patient capable of signing voluntary admission?: Yes Transfer from: Unknown Referral Source: Self/Family/Friend  Medical Screening Exam [facility] Walk-in ONLY) Medical Exam completed: No Reason for MSE not completed:  Other: (labs incomplete)  [facility] Living Arrangements: Other (Comment) (Homeless) Name of Psychiatrist: none Name of Therapist: none  Education Status Is patient currently in school?: No Current Grade: na Highest grade of school patient has completed: na Name of school: na Contact person: na  Risk to self with the past 6 months Suicidal Ideation: No (denies) Suicidal Intent: No Is patient at risk for suicide?: No Suicidal Plan?: No Access to Means: No (denies) What has been your use of drugs/alcohol within the last 12 months?: daily use Previous Attempts/Gestures: No How many times?: 0 Other Self Harm Risks: none per pt Triggers for Past Attempts:  (na) Intentional Self Injurious Behavior: None (denies) Family Suicide History: Unknown Recent stressful life event(s): Job Loss, Turmoil (Comment) (loss of home; found out she is pregnant) Persecutory voices/beliefs?: No Depression: Yes Depression Symptoms: Tearfulness, Isolating, Fatigue, Guilt, Loss of interest in usual pleasures, Feeling worthless/self pity, Feeling angry/irritable Substance abuse history and/or treatment for substance abuse?: No Suicide prevention information given to non-admitted patients: Not applicable  Risk to Others within the past 6 months Homicidal Ideation: No (denies) Thoughts of Harm to Others: No (denies) Current Homicidal Intent: No Current Homicidal Plan: No Access to Homicidal Means: No Identified Victim: na History of harm to others?: No (denies) Assessment of Violence: None Noted Violent Behavior Description: pt describes daily irritability Does patient have access to weapons?: No (denies) Criminal Charges Pending?: No Does patient have a court date: No  Psychosis Hallucinations: None noted Delusions: None noted  Mental Status Report Appearance/Hygiene: Disheveled (in street clothes) Eye Contact: Good Motor Activity: Gestures, Mannerisms, Unremarkable Speech:  Logical/coherent Level of Consciousness: Alert Mood: Depressed Affect: Depressed, Constricted Anxiety Level: Minimal Thought Processes: Coherent, Relevant Judgement: Partial Orientation: Person, Place, Time, Situation Obsessive Compulsive Thoughts/Behaviors: Unable to Assess  Cognitive Functioning Concentration: Fair Memory: Recent Intact, Remote Intact IQ: Average Insight: Fair Impulse Control: Poor Appetite: Good Weight Loss: 0 Weight Gain: 0 Sleep: No Change Total Hours of Sleep: 7 Vegetative Symptoms: None  ADLScreening [facility] Assessment Services) Patient's cognitive ability adequate to safely complete daily activities?: Yes Patient able to express need for assistance with ADLs?: Yes Independently performs ADLs?: Yes (appropriate for developmental age)  Prior Inpatient Therapy Prior Inpatient Therapy: No Prior Therapy Dates: na Prior Therapy Facilty/Provider(s): na Reason for Treatment: na  Prior Outpatient Therapy Prior Outpatient Therapy: No Prior Therapy Dates: na Prior Therapy Facilty/Provider(s): na Reason for Treatment: na  ADL Screening (condition at time of admission) Patient's cognitive ability adequate to safely complete daily activities?: Yes Patient able to express need for assistance with ADLs?: Yes Independently performs ADLs?: Yes (appropriate for developmental age)       Abuse/Neglect Assessment (Assessment to be complete while patient is alone) Physical Abuse: Denies Verbal Abuse: Denies Sexual Abuse: Denies     Advance Directives (For Healthcare) Does patient have an advance directive?: No Would patient like information on creating an advanced directive?: No - patient declined information    Additional Information 1:1 In Past 12 Months?: No CIRT Risk: No Elopement Risk: No Does patient have medical clearance?: No     Disposition:  Disposition Initial Assessment Completed for this Encounter: Yes Disposition of Patient: Other  dispositions (Pending review w [facility] Extender) Other disposition(s): Other (Comment)  Per Donell SievertSpencer Simon, PA:  Does not meet  IP criteria.  Recommend discharge with OP resources.   Spoke with Donetta Potts, NP at [facility]:  Advised of recommendation.  She agreed.  She had a question regarding prescribing pt medications.  Gave her cell number for [employer]. PA for consultation.  Spoke with Arlys John, nurse at [facility]:  Advised of plan.  He confirmed that he had OP Resources listing for SA to give pt.  Beryle Flock, MS, CRC, [facility] Triage Specialist [facility] T 04/07/2015 12:34 AM

## 2015-04-07 NOTE — ED Notes (Signed)
Different TTS machine at bedside; TTS in process

## 2015-04-07 NOTE — Progress Notes (Signed)
[facility]. She has been given 2 bus passes and [facility] taxi [facility] in [facility] and [facility] in [facility] morning regarding contacts and referrals.  All resources given to patient and she voices understanding to follow up with: [facility] [place] [facility] [place].   No family or friends have been contacted as patient refuses intervention. She is agreeable to plan at this time. RN made aware of DC plan.  Deretha EmoryHannah Johnice Riebe LCSW, MSW Clinical Social Work: Emergency Room [phone]

## 2015-04-07 NOTE — ED Notes (Signed)
Social work has seen pt and is working on referrals

## 2015-04-08 ENCOUNTER — Telehealth: Payer: Self-pay | Admitting: Professional Counselor

## 2015-04-08 NOTE — Telephone Encounter (Signed)
LCSW received phone call this morning from [facility] ([facility] abuse [facility]atal residential treatment facility). Patient has been accepted, and bed opening early next week. LCSW called [facility] case manager Lupita LeashDonna to see if patient completed paperwork for [facility] in which she did and then [facility]. LCSW called this morning, but patient has not been seen. Message left to have patient call LCSW regarding her inpatient program and numbers left.  Will follow up and coordinate care for patient if she is motivated and calls back.  Dana EmoryHannah Jackqulyn Mendel LCSW, MSW Clinical Social Work: Emergency Room [phone]

## 2015-04-11 ENCOUNTER — Telehealth: Payer: Self-pay | Admitting: Professional Counselor

## 2015-04-11 NOTE — Telephone Encounter (Signed)
LCS[facility]:  [phone] as patient is a current resident at the [facility]. Spoke with house supervisor and patient regarding [facility]'s referral [facility] and [facility].   Patient is still agreeable with going and made call with LCSW to admission as to when her bed would be available.  Wednesday patient can be transported for long term treatment for ETOH and pregnancy for up to 9 months. Patient reports being sober over weekend and has a bed [facility] for [facility] or until she gets into program. She expresses excitement of becoming a mother and maintain sobriety in program.   Patient's only barrier to transportation and getting to treatment center.  LCSW encouraged patient to call father, father's girlfriend, and any friends she trust in effort to find a way to treatment. LCSW is going to call case manager at [facility]: Lupita Leashonna for any assistance or resources as well as [facility] if they have any options. LCSW also Engineer, maintenance (IT)emailed Director of SW for any options.    Will be following up with patient with regards to means of transport.  Dana EmoryHannah Xanthe Couillard LCSW, MSW Clinical Social Work: Emergency Room [phone]

## 2015-04-11 NOTE — Telephone Encounter (Signed)
LCSW had transportation approved by Interior and spatial designerDirector of SW: [employer]. Patient can be admitted to treatment on Tuesday May 3rd with [facility] coming [place] [facility] [place] [place]. Train leaves at 8:39am and arrives at 9:42am.  Spoke with Herbert SetaHeather at [facility] and she will pick patient up around 9:45am. Patient given all instructions for transport and agreeable. LCSW completed train ticket.    No other needs at this time.  Deretha EmoryHannah Keziah Drotar LCSW, MSW Clinical Social Work: Emergency Room [phone]

## 2015-11-07 LAB — OB RESULTS CONSOLE HIV ANTIBODY (ROUTINE TESTING): HIV: NONREACTIVE

## 2015-11-29 ENCOUNTER — Encounter (HOSPITAL_COMMUNITY): Payer: Self-pay | Admitting: *Deleted

## 2015-11-29 ENCOUNTER — Inpatient Hospital Stay (HOSPITAL_COMMUNITY)
Admission: AD | Admit: 2015-11-29 | Discharge: 2015-11-29 | Disposition: A | Discharge status: Home / Self Care | Payer: Medicaid Other | Source: Ambulatory Visit | Attending: Obstetrics & Gynecology | Admitting: Obstetrics & Gynecology

## 2015-11-29 DIAGNOSIS — O4703 False labor before 37 completed weeks of gestation, third trimester: Secondary | ICD-10-CM

## 2015-11-29 DIAGNOSIS — Z3A40 40 weeks gestation of pregnancy: Secondary | ICD-10-CM | POA: Diagnosis not present

## 2015-11-29 DIAGNOSIS — O471 False labor at or after 37 completed weeks of gestation: Secondary | ICD-10-CM | POA: Diagnosis not present

## 2015-11-29 DIAGNOSIS — O0933 Supervision of pregnancy with insufficient antenatal care, third trimester: Secondary | ICD-10-CM | POA: Insufficient documentation

## 2015-11-29 DIAGNOSIS — O479 False labor, unspecified: Secondary | ICD-10-CM

## 2015-11-29 LAB — GLUCOSE, CAPILLARY: Glucose-Capillary: 87 mg/dL (ref 65–99)

## 2015-11-29 NOTE — MAU Note (Signed)
Has not been to dr in a couple months, moved a few wks ago.  Was due 3 days ago.  Wants to know why she isn't contracting or her water hasn't broke yet

## 2015-11-29 NOTE — Discharge Instructions (Signed)
Reasons to return to MAU:  1.  Contractions are  5 minutes apart or less, each last 1 minute, these have been going on for 1-2 hours, and you cannot walk or talk during them 2.  You have a large gush of fluid, or a trickle of fluid that will not stop and you have to wear a pad 3.  You have bleeding that is bright red, heavier than spotting--like menstrual bleeding (spotting can be normal in early labor or after a check of your cervix) 4.  You do not feel the baby moving like he/she normally does  Third Trimester of Pregnancy The third trimester is from week 29 through week 42, months 7 through 9. The third trimester is a time when the fetus is growing rapidly. At the end of the ninth month, the fetus is about 20 inches in length and weighs 6-10 pounds.  BODY CHANGES Your body goes through many changes during pregnancy. The changes vary from woman to woman.   Your weight will continue to increase. You can expect to gain 25-35 pounds (11-16 kg) by the end of the pregnancy.  You may begin to get stretch marks on your hips, abdomen, and breasts.  You may urinate more often because the fetus is moving lower into your pelvis and pressing on your bladder.  You may develop or continue to have heartburn as a result of your pregnancy.  You may develop constipation because certain hormones are causing the muscles that push waste through your intestines to slow down.  You may develop hemorrhoids or swollen, bulging veins (varicose veins).  You may have pelvic pain because of the weight gain and pregnancy hormones relaxing your joints between the bones in your pelvis. Backaches may result from overexertion of the muscles supporting your posture.  You may have changes in your hair. These can include thickening of your hair, rapid growth, and changes in texture. Some women also have hair loss during or after pregnancy, or hair that feels dry or thin. Your hair will most likely return to normal after your  baby is born.  Your breasts will continue to grow and be tender. A yellow discharge may leak from your breasts called colostrum.  Your belly button may stick out.  You may feel short of breath because of your expanding uterus.  You may notice the fetus "dropping," or moving lower in your abdomen.  You may have a bloody mucus discharge. This usually occurs a few days to a week before labor begins.  Your cervix becomes thin and soft (effaced) near your due date. WHAT TO EXPECT AT YOUR PRENATAL EXAMS  You will have prenatal exams every 2 weeks until week 36. Then, you will have weekly prenatal exams. During a routine prenatal visit:  You will be weighed to make sure you and the fetus are growing normally.  Your blood pressure is taken.  Your abdomen will be measured to track your baby's growth.  The fetal heartbeat will be listened to.  Any test results from the previous visit will be discussed.  You may have a cervical check near your due date to see if you have effaced. At around 36 weeks, your caregiver will check your cervix. At the same time, your caregiver will also perform a test on the secretions of the vaginal tissue. This test is to determine if a type of bacteria, Group B streptococcus, is present. Your caregiver will explain this further. Your caregiver may ask you:  What your  birth plan is.  How you are feeling.  If you are feeling the baby move.  If you have had any abnormal symptoms, such as leaking fluid, bleeding, severe headaches, or abdominal cramping.  If you are using any tobacco products, including cigarettes, chewing tobacco, and electronic cigarettes.  If you have any questions. Other tests or screenings that may be performed during your third trimester include:  Blood tests that check for low iron levels (anemia).  Fetal testing to check the health, activity level, and growth of the fetus. Testing is done if you have certain medical conditions or if  there are problems during the pregnancy.  HIV (human immunodeficiency virus) testing. If you are at high risk, you may be screened for HIV during your third trimester of pregnancy. FALSE LABOR You may feel small, irregular contractions that eventually go away. These are called Braxton Hicks contractions, or false labor. Contractions may last for hours, days, or even weeks before true labor sets in. If contractions come at regular intervals, intensify, or become painful, it is best to be seen by your caregiver.  SIGNS OF LABOR   Menstrual-like cramps.  Contractions that are 5 minutes apart or less.  Contractions that start on the top of the uterus and spread down to the lower abdomen and back.  A sense of increased pelvic pressure or back pain.  A watery or bloody mucus discharge that comes from the vagina. If you have any of these signs before the 37th week of pregnancy, call your caregiver right away. You need to go to the hospital to get checked immediately. HOME CARE INSTRUCTIONS   Avoid all smoking, herbs, alcohol, and unprescribed drugs. These chemicals affect the formation and growth of the baby.  Do not use any tobacco products, including cigarettes, chewing tobacco, and electronic cigarettes. If you need help quitting, ask your health care provider. You may receive counseling support and other resources to help you quit.  Follow your caregiver's instructions regarding medicine use. There are medicines that are either safe or unsafe to take during pregnancy.  Exercise only as directed by your caregiver. Experiencing uterine cramps is a good sign to stop exercising.  Continue to eat regular, healthy meals.  Wear a good support bra for breast tenderness.  Do not use hot tubs, steam rooms, or saunas.  Wear your seat belt at all times when driving.  Avoid raw meat, uncooked cheese, cat litter boxes, and soil used by cats. These carry germs that can cause birth defects in the  baby.  Take your prenatal vitamins.  Take 1500-2000 mg of calcium daily starting at the 20th week of pregnancy until you deliver your baby.  Try taking a stool softener (if your caregiver approves) if you develop constipation. Eat more high-fiber foods, such as fresh vegetables or fruit and whole grains. Drink plenty of fluids to keep your urine clear or pale yellow.  Take warm sitz baths to soothe any pain or discomfort caused by hemorrhoids. Use hemorrhoid cream if your caregiver approves.  If you develop varicose veins, wear support hose. Elevate your feet for 15 minutes, 3-4 times a day. Limit salt in your diet.  Avoid heavy lifting, wear low heal shoes, and practice good posture.  Rest a lot with your legs elevated if you have leg cramps or low back pain.  Visit your dentist if you have not gone during your pregnancy. Use a soft toothbrush to brush your teeth and be gentle when you floss.  A  sexual relationship may be continued unless your caregiver directs you otherwise.  Do not travel far distances unless it is absolutely necessary and only with the approval of your caregiver.  Take prenatal classes to understand, practice, and ask questions about the labor and delivery.  Make a trial run to the hospital.  Pack your hospital bag.  Prepare the baby's nursery.  Continue to go to all your prenatal visits as directed by your caregiver. SEEK MEDICAL CARE IF:  You are unsure if you are in labor or if your water has broken.  You have dizziness.  You have mild pelvic cramps, pelvic pressure, or nagging pain in your abdominal area.  You have persistent nausea, vomiting, or diarrhea.  You have a bad smelling vaginal discharge.  You have pain with urination. SEEK IMMEDIATE MEDICAL CARE IF:   You have a fever.  You are leaking fluid from your vagina.  You have spotting or bleeding from your vagina.  You have severe abdominal cramping or pain.  You have rapid weight  loss or gain.  You have shortness of breath with chest pain.  You notice sudden or extreme swelling of your face, hands, ankles, feet, or legs.  You have not felt your baby move in over an hour.  You have severe headaches that do not go away with medicine.  You have vision changes.   This information is not intended to replace advice given to you by your health care provider. Make sure you discuss any questions you have with your health care provider.   Document Released: 11/20/2001 Document Revised: 12/17/2014 Document Reviewed: 01/27/2013 Elsevier Interactive Patient Education [employer]2016 Elsevier Inc.

## 2015-11-29 NOTE — MAU Provider Note (Signed)
Chief Complaint:  wants cervix checked    First Provider Initiated Contact with Patient 11/29/15 [phone]      HPI: Dana SongSheila Matthews is a 29 y.o. G1P0 at 6664w1d who presents to maternity admissions reporting [place] and has not had prenatal visit in 2 weeks. She reports intermittent mild cramping which has not required treatment, and denies regular painful contractions, LOF, or other signs of labor.  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  She reports she did not have glucose test during pregnancy, but had US and GBS swab collected at 36 weeks.  Records requested.  She reports good fetal movement, denies vaginal bleeding, vaginal itching/burning, urinary symptoms, h/a, dizziness, n/v, or fever/chills.    HPI  Past Medical History: Past Medical History  Diagnosis Date  . Medical history non-contributory     Past obstetric history: OB History  Gravida Para Term Preterm AB SAB TAB Ectopic Multiple Living  1             # Outcome Date GA Lbr Len/2nd Weight Sex Delivery Anes PTL Lv  1 Current               Past Surgical History: Past Surgical History  Procedure Laterality Date  . No past surgeries      Family History: History reviewed. No pertinent family history.  Social History: Social History  Substance Use Topics  . Smoking status: Current Every Day Smoker -- 0.25 packs/day    Types: Cigarettes  . Smokeless tobacco: None  . Alcohol Use: No    Allergies: No Known Allergies  Meds:  No prescriptions prior to admission    ROS:  Review of Systems  Constitutional: Negative for fever, chills and fatigue.  Respiratory: Negative for shortness of breath.   Cardiovascular: Negative for chest pain.  Genitourinary: Negative for dysuria, flank pain, vaginal bleeding, vaginal discharge, difficulty  urinating, vaginal pain and pelvic pain.  Neurological: Negative for dizziness and headaches.  Psychiatric/Behavioral: Negative.      I have reviewed patient's Past Medical Hx, Surgical Hx, Family Hx, Social Hx, medications and allergies.   Physical Exam   Patient Vitals for the past 24 hrs:  BP Temp Temp src Pulse Resp  11/29/15 1037 112/56 mmHg - - 91 18  11/29/15 0831 110/64 mmHg 98.2 F (36.8 C) Oral 107 18   Constitutional: Well-developed, well-nourished female in no acute distress.  Cardiovascular: normal rate Respiratory: normal effort GI: Abd soft, non-tender, gravid appropriate for gestational age.  MS: Extremities nontender, no edema, normal ROM Neurologic: Alert and oriented x 4.  GU: Neg CVAT.   Dilation: 1 Effacement (%): 70 Cervical Position: Posterior Station: -3 Presentation: Vertex Exam by:: Leftwich-Kirby, CNM   Fundal height 38 cm, Fetus vertex by Leopolds, EFW ~ 7 lbs  FHT:  Baseline 135 , moderate variability, accelerations present, no decelerations Contractions:None on toco or to palpation   Labs: Results for orders placed or performed during the hospital encounter of 11/29/15 (from the past 24 hour(s))  Glucose, capillary     Status: None   Collection Time: 11/29/15 10:13 AM  Result Value Ref Range   Glucose-Capillary 87 65 - 99 mg/dL      Imaging:  No results found.  MAU Course/MDM: I have ordered labs and reviewed results.  Reviewed FHR tracing.  Pt stable at time of discharge.  Assessment: 1. Limited prenatal care in third trimester   2. Braxton Hicks contractions     Plan: Discharge home Labor precautions and fetal kick counts     Follow-up Information    Follow up with [facility].   Specialty:  Obstetrics and Gynecology   Why:  Friday morning 12/23 at 8:00 am , Return to MAU as needed for emergencies or signs of labor   Contact information:   [address] [place] [address] [phone]        Medication List    TAKE these medications        prenatal multivitamin Tabs tablet  Take 1 tablet by mouth daily at 12 noon.        Sharen Counter Certified Nurse-Midwife 11/29/2015 11:35 AM

## 2015-11-29 NOTE — MAU Note (Signed)
Urine in lab 

## 2015-12-01 ENCOUNTER — Inpatient Hospital Stay (HOSPITAL_COMMUNITY)
Admission: AD | Admit: 2015-12-01 | Discharge: 2015-12-03 | DRG: 775 | Disposition: A | Discharge status: Home / Self Care | Payer: Medicaid Other | Source: Ambulatory Visit | Attending: Family Medicine | Admitting: Family Medicine

## 2015-12-01 ENCOUNTER — Encounter (HOSPITAL_COMMUNITY): Payer: Self-pay

## 2015-12-01 ENCOUNTER — Inpatient Hospital Stay (HOSPITAL_COMMUNITY): Payer: Medicaid Other | Admitting: Anesthesiology

## 2015-12-01 DIAGNOSIS — Z3A40 40 weeks gestation of pregnancy: Secondary | ICD-10-CM

## 2015-12-01 DIAGNOSIS — IMO0001 Reserved for inherently not codable concepts without codable children: Secondary | ICD-10-CM

## 2015-12-01 DIAGNOSIS — O4292 Full-term premature rupture of membranes, unspecified as to length of time between rupture and onset of labor: Secondary | ICD-10-CM | POA: Diagnosis present

## 2015-12-01 DIAGNOSIS — F17210 Nicotine dependence, cigarettes, uncomplicated: Secondary | ICD-10-CM | POA: Diagnosis present

## 2015-12-01 DIAGNOSIS — O99334 Smoking (tobacco) complicating childbirth: Secondary | ICD-10-CM | POA: Diagnosis present

## 2015-12-01 LAB — CBC
HEMATOCRIT: 32.4 % — AB (ref 36.0–46.0)
HEMOGLOBIN: 11.1 g/dL — AB (ref 12.0–15.0)
MCH: 30.8 pg (ref 26.0–34.0)
MCHC: 34.3 g/dL (ref 30.0–36.0)
MCV: 90 fL (ref 78.0–100.0)
Platelets: 362 10*3/uL (ref 150–400)
RBC: 3.6 MIL/uL — ABNORMAL LOW (ref 3.87–5.11)
RDW: 13.9 % (ref 11.5–15.5)
WBC: 13.5 10*3/uL — ABNORMAL HIGH (ref 4.0–10.5)

## 2015-12-01 LAB — TYPE AND SCREEN
ABO/RH(D): O POS
Antibody Screen: NEGATIVE

## 2015-12-01 LAB — GROUP B STREP BY PCR: Group B strep by PCR: NEGATIVE

## 2015-12-01 LAB — ABO/RH: ABO/RH(D): O POS

## 2015-12-01 LAB — RPR: RPR Ser Ql: NONREACTIVE

## 2015-12-01 MED ORDER — BENZOCAINE-MENTHOL 20-0.5 % EX AERO: 1.0000 "application " | INHALATION_SPRAY | CUTANEOUS | Status: DC | PRN

## 2015-12-01 MED ORDER — LACTATED RINGERS IV SOLN: 500.0000 mL | INTRAVENOUS | Status: DC | PRN

## 2015-12-01 MED ORDER — OXYTOCIN BOLUS FROM INFUSION: 500.0000 mL | INTRAVENOUS | Status: DC

## 2015-12-01 MED ORDER — PENICILLIN G POTASSIUM 5000000 UNITS IJ SOLR
5.0000 10*6.[IU] | Freq: Once | INTRAMUSCULAR | Status: AC
  Administered 2015-12-01: 5 10*6.[IU] via INTRAVENOUS
  Filled 2015-12-01: qty 5

## 2015-12-01 MED ORDER — ACETAMINOPHEN 325 MG PO TABS: 650.0000 mg | ORAL_TABLET | ORAL | Status: DC | PRN

## 2015-12-01 MED ORDER — DOCUSATE SODIUM 100 MG PO CAPS
100.0000 mg | ORAL_CAPSULE | Freq: Two times a day (BID) | ORAL | Status: DC
  Administered 2015-12-02 – 2015-12-03 (×4): 100 mg via ORAL
  Filled 2015-12-01 (×4): qty 1

## 2015-12-01 MED ORDER — CITRIC ACID-SODIUM CITRATE 334-500 MG/5ML PO SOLN: 30.0000 mL | ORAL | Status: DC | PRN

## 2015-12-01 MED ORDER — LACTATED RINGERS IV SOLN
INTRAVENOUS | Status: DC
  Administered 2015-12-01 (×2): via INTRAVENOUS

## 2015-12-01 MED ORDER — DIPHENHYDRAMINE HCL 50 MG/ML IJ SOLN
12.5000 mg | INTRAMUSCULAR | Status: DC | PRN
Start: 2015-12-01 — End: 2015-12-01

## 2015-12-01 MED ORDER — TERBUTALINE SULFATE 1 MG/ML IJ SOLN
0.2500 mg | Freq: Once | INTRAMUSCULAR | Status: DC | PRN
  Filled 2015-12-01: qty 1

## 2015-12-01 MED ORDER — PNEUMOCOCCAL VAC POLYVALENT 25 MCG/0.5ML IJ INJ
0.5000 mL | INJECTION | INTRAMUSCULAR | Status: AC
  Administered 2015-12-02: 0.5 mL via INTRAMUSCULAR
  Filled 2015-12-01: qty 0.5

## 2015-12-01 MED ORDER — OXYTOCIN 40 UNITS IN LACTATED RINGERS INFUSION - SIMPLE MED
62.5000 mL/h | INTRAVENOUS | Status: DC
  Administered 2015-12-01: 62.5 mL/h via INTRAVENOUS
  Filled 2015-12-01: qty 1000

## 2015-12-01 MED ORDER — ONDANSETRON HCL 4 MG/2ML IJ SOLN: 4.0000 mg | INTRAMUSCULAR | Status: DC | PRN

## 2015-12-01 MED ORDER — LIDOCAINE HCL (PF) 1 % IJ SOLN
30.0000 mL | INTRAMUSCULAR | Status: AC | PRN
  Administered 2015-12-01: 30 mL via SUBCUTANEOUS
  Filled 2015-12-01: qty 30

## 2015-12-01 MED ORDER — OXYCODONE-ACETAMINOPHEN 5-325 MG PO TABS: 1.0000 | ORAL_TABLET | ORAL | Status: DC | PRN

## 2015-12-01 MED ORDER — LANOLIN HYDROUS EX OINT: TOPICAL_OINTMENT | CUTANEOUS | Status: DC | PRN

## 2015-12-01 MED ORDER — PHENYLEPHRINE 40 MCG/ML (10ML) SYRINGE FOR IV PUSH (FOR BLOOD PRESSURE SUPPORT)
80.0000 ug | PREFILLED_SYRINGE | Freq: Once | INTRAVENOUS | Status: DC | PRN
  Administered 2015-12-01: 80 ug via INTRAVENOUS
  Filled 2015-12-01: qty 2

## 2015-12-01 MED ORDER — LIDOCAINE HCL (PF) 1 % IJ SOLN
INTRAMUSCULAR | Status: DC | PRN
  Administered 2015-12-01: 4 mL via EPIDURAL

## 2015-12-01 MED ORDER — IBUPROFEN 600 MG PO TABS
600.0000 mg | ORAL_TABLET | Freq: Four times a day (QID) | ORAL | Status: DC
Start: 2015-12-01 — End: 2015-12-03
  Administered 2015-12-02 – 2015-12-03 (×7): 600 mg via ORAL
  Filled 2015-12-01 (×7): qty 1

## 2015-12-01 MED ORDER — PHENYLEPHRINE 40 MCG/ML (10ML) SYRINGE FOR IV PUSH (FOR BLOOD PRESSURE SUPPORT)
80.0000 ug | PREFILLED_SYRINGE | INTRAVENOUS | Status: AC | PRN
  Administered 2015-12-01 (×3): 80 ug via INTRAVENOUS
  Filled 2015-12-01: qty 20

## 2015-12-01 MED ORDER — ONDANSETRON HCL 4 MG PO TABS: 4.0000 mg | ORAL_TABLET | ORAL | Status: DC | PRN

## 2015-12-01 MED ORDER — PRENATAL MULTIVITAMIN CH
1.0000 | ORAL_TABLET | Freq: Every day | ORAL | Status: DC
  Administered 2015-12-02 – 2015-12-03 (×2): 1 via ORAL
  Filled 2015-12-01 (×2): qty 1

## 2015-12-01 MED ORDER — DIBUCAINE 1 % RE OINT: 1.0000 "application " | TOPICAL_OINTMENT | RECTAL | Status: DC | PRN

## 2015-12-01 MED ORDER — WITCH HAZEL-GLYCERIN EX PADS: 1.0000 "application " | MEDICATED_PAD | CUTANEOUS | Status: DC | PRN

## 2015-12-01 MED ORDER — ONDANSETRON HCL 4 MG/2ML IJ SOLN: 4.0000 mg | Freq: Four times a day (QID) | INTRAMUSCULAR | Status: DC | PRN

## 2015-12-01 MED ORDER — FENTANYL 2.5 MCG/ML BUPIVACAINE 1/10 % EPIDURAL INFUSION (WH - ANES)
14.0000 mL/h | INTRAMUSCULAR | Status: DC | PRN
  Administered 2015-12-01: 14 mL/h via EPIDURAL
  Filled 2015-12-01: qty 125

## 2015-12-01 MED ORDER — FENTANYL CITRATE (PF) 100 MCG/2ML IJ SOLN
50.0000 ug | INTRAMUSCULAR | Status: DC | PRN
  Administered 2015-12-01 (×2): 100 ug via INTRAVENOUS
  Filled 2015-12-01 (×2): qty 2

## 2015-12-01 MED ORDER — PHENYLEPHRINE 40 MCG/ML (10ML) SYRINGE FOR IV PUSH (FOR BLOOD PRESSURE SUPPORT)
PREFILLED_SYRINGE | INTRAVENOUS | Status: AC
  Filled 2015-12-01: qty 20

## 2015-12-01 MED ORDER — PENICILLIN G POTASSIUM 5000000 UNITS IJ SOLR
2.5000 10*6.[IU] | INTRAVENOUS | Status: DC
  Filled 2015-12-01 (×2): qty 2.5

## 2015-12-01 MED ORDER — EPHEDRINE 5 MG/ML INJ
10.0000 mg | INTRAVENOUS | Status: DC | PRN
  Filled 2015-12-01: qty 2

## 2015-12-01 MED ORDER — SODIUM BICARBONATE 8.4 % IV SOLN
INTRAVENOUS | Status: DC | PRN
  Administered 2015-12-01: 4 mL via EPIDURAL

## 2015-12-01 MED ORDER — DIPHENHYDRAMINE HCL 25 MG PO CAPS: 25.0000 mg | ORAL_CAPSULE | Freq: Four times a day (QID) | ORAL | Status: DC | PRN

## 2015-12-01 MED ORDER — SIMETHICONE 80 MG PO CHEW: 80.0000 mg | CHEWABLE_TABLET | ORAL | Status: DC | PRN

## 2015-12-01 MED ORDER — OXYTOCIN 40 UNITS IN LACTATED RINGERS INFUSION - SIMPLE MED: 62.5000 mL/h | INTRAVENOUS | Status: DC | PRN

## 2015-12-01 MED ORDER — OXYTOCIN 40 UNITS IN LACTATED RINGERS INFUSION - SIMPLE MED
1.0000 m[IU]/min | INTRAVENOUS | Status: DC
  Administered 2015-12-01: 2 m[IU]/min via INTRAVENOUS

## 2015-12-01 MED ORDER — OXYCODONE-ACETAMINOPHEN 5-325 MG PO TABS: 2.0000 | ORAL_TABLET | ORAL | Status: DC | PRN

## 2015-12-01 NOTE — Progress Notes (Signed)
Labor Progress Note Dana SongSheila Matthews is a 29 y.o. G1P0 at 938w3d presented for PROM   S: Regular strong contractions, now more comfortable with epidural O:  BP 106/48 mmHg  Pulse 100  Temp(Src) 98.5 F (36.9 C) (Oral)  Resp 18  Ht 5\' 4"  (1.626 m)  Wt 182 lb (82.555 kg)  BMI 31.22 kg/m2  SpO2 99%  LMP 02/22/2015 EFM: 120/mod/+accels, no decels  CVE: Dilation: 9 Effacement (%): 100 Cervical Position: Posterior Station: 0 Presentation: Vertex Exam by:: Dr. Alvester MorinNewton  FSE placed  A&P: 29 y.o. G1P0 138w3d here with PROM #Labor: Progressing well.  #Pain: IV meds prn, Epidural prn #FWB: Cat I, FSE given close maternal HR #GBS- unknown, no reason for empiric treatment. GBS PCR NEGATIVE   Federico FlakeKimberly Niles Jaran Sainz, MD 1:48 PM

## 2015-12-01 NOTE — Progress Notes (Signed)
Called by RN for change in infant baseline after epidural. Mother has drop in BP from normal range to 90s/50 and is s/p 3 doses of phenylephrine.  Asked RN to speak to Anesthesia about additional doses. Ordered 1 L LR bolus   Federico FlakeKimberly Niles Clide Remmers, MD

## 2015-12-01 NOTE — Progress Notes (Signed)
Labor Progress Note Dana SongSheila Matthews is a 29 y.o. G1P0 at 3624w3d presented for PROM   S: Regular strong contractions O:  BP 116/63 mmHg  Pulse 71  Temp(Src) 97.6 F (36.4 C) (Oral)  Resp 18  Ht 5\' 4"  (1.626 m)  Wt 182 lb (82.555 kg)  BMI 31.22 kg/m2  SpO2 99%  LMP 02/22/2015 EFM: 120/mod/+accels, no decels  CVE: Dilation: 5.5 Effacement (%): 100 Cervical Position: Posterior Station: -1 Presentation: Vertex Exam by:: Dr. Alvester MorinNewton   A&P: 29 y.o. G1P0 624w3d here with PROM #Labor: Progressing well #Pain: IV meds prn, Epidural prn #FWB: Cat I #GBS- unknown, no reason for empiric treatment. Will get GBS PCR  Dana FlakeKimberly Niles Keyleen Cerrato, MD 10:13 AM

## 2015-12-01 NOTE — MAU Note (Signed)
Pt reports really strong contractions, denies bleeding or ROM.

## 2015-12-01 NOTE — Anesthesia Preprocedure Evaluation (Signed)
Anesthesia Evaluation  Patient identified by MRN, date of birth, ID band Patient awake    Reviewed: Allergy & Precautions, NPO status , Patient's Chart, lab work & pertinent test results  Airway Mallampati: III   Neck ROM: Full    Dental  (+) Teeth Intact   Pulmonary Current Smoker,    breath sounds clear to auscultation       Cardiovascular negative cardio ROS   Rhythm:Regular Rate:Normal     Neuro/Psych negative neurological ROS  negative psych ROS   GI/Hepatic negative GI ROS, Neg liver ROS,   Endo/Other  negative endocrine ROS  Renal/GU negative Renal ROS  negative genitourinary   Musculoskeletal negative musculoskeletal ROS (+)   Abdominal   Peds negative pediatric ROS (+)  Hematology negative hematology ROS (+)   Anesthesia Other Findings   Reproductive/Obstetrics (+) Pregnancy                             Lab Results  Component Value Date   WBC 13.5* 12/01/2015   HGB 11.1* 12/01/2015   HCT 32.4* 12/01/2015   MCV 90.0 12/01/2015   PLT 362 12/01/2015   No results found for: INR, PROTIME   Anesthesia Physical Anesthesia Plan  ASA: II  Anesthesia Plan: Epidural   Post-op Pain Management:    Induction:   Airway Management Planned:   Additional Equipment:   Intra-op Plan:   Post-operative Plan:   Informed Consent: I have reviewed the patients History and Physical, chart, labs and discussed the procedure including the risks, benefits and alternatives for the proposed anesthesia with the patient or authorized representative who has indicated his/her understanding and acceptance.     Plan Discussed with:   Anesthesia Plan Comments:         Anesthesia Quick Evaluation

## 2015-12-01 NOTE — H&P (Signed)
OBSTETRIC ADMISSION HISTORY AND PHYSICAL  Dana Matthews is a 29 y.o. female G1P0 with IUP at 3825w3d by LMP presenting for PROM and early labor. She reports +FMs, No LOF, no VB, no blurry vision, headaches or peripheral edema, and RUQ pain.  She plans on breast feeding. She is unsure what she wants for birth control. [place] and [place].   Dating: By LMP --->  Estimated Date of Delivery: 11/28/15  Sono:  DO NOT HAVE RECORDS   Prenatal History/Complications: Insufficient Prenatal Care Tobacco Use  Past Medical History: Past Medical History  Diagnosis Date  . Medical history non-contributory     Past Surgical History: Past Surgical History  Procedure Laterality Date  . No past surgeries      Obstetrical History: OB History    Gravida Para Term Preterm AB TAB SAB Ectopic Multiple Living   1               Social History: Social History   Social History  . Marital Status: Single    Spouse Name: N/A  . Number of Children: N/A  . Years of Education: N/A   Social History Main Topics  . Smoking status: Current Every Day Smoker -- 0.25 packs/day    Types: Cigarettes  . Smokeless tobacco: None  . Alcohol Use: No  . Drug Use: No  . Sexual Activity: Yes   Other Topics Concern  . None   Social History Narrative    Family History: History reviewed. No pertinent family history.  Allergies: No Known Allergies  Prescriptions prior to admission  Medication Sig Dispense Refill Last Dose  . Prenatal Vit-Fe Fumarate-FA (PRENATAL MULTIVITAMIN) TABS tablet Take 1 tablet by mouth daily at 12 noon.   Past Week at Unknown time     Review of Systems   All systems reviewed and negative except as stated in HPI  Blood pressure 111/56, pulse 92, temperature 98 F (36.7 C), temperature source Oral, resp. rate 16, height 5\' 4"  (1.626 m), weight 82.555 kg (182 lb), last menstrual period 02/22/2015, SpO2 99 %. General appearance:  alert, cooperative, appears stated age and mild distress Lungs: clear to auscultation bilaterally Heart: regular rate and rhythm Abdomen: soft, non-tender; bowel sounds normal Extremities: Homans sign is negative, no sign of DVT Presentation: cephalic Fetal monitoringBaseline: 140 bpm, Variability: Good {> 6 bpm) and Accelerations: Non-reactive but appropriate for gestational age Uterine activity: Date/time of onset: 1AM and Frequency: Every 3-4 minutes Dilation: 2 Effacement (%): 80 Station: -3 Exam by:: D Simpson RN   Prenatal labs: INCOMPLETE RECORDS ABO, Rh:   Antibody:   Rubella: unknown RPR:   NEG HBsAg:    HIV:   NR GBS:   UNKNOWN 1 hr Glucola  Genetic screening   Anatomy US no records  Prenatal Transfer Tool  Maternal Diabetes: No Genetic Screening: Declined Maternal Ultrasounds/Referrals: Declined Fetal Ultrasounds or other Referrals:  None Maternal Substance Abuse:  Yes:  Type: Smoker Significant Maternal Medications:  None Significant Maternal Lab Results: None  No results found for this or any previous visit (from the past 24 hour(s)).  Patient Active Problem List   Diagnosis Date Noted  . Active labor at term 12/01/2015    Assessment: Dana SongSheila Matthews is a 29 y.o. G1P0 at 2025w3d here for PROM and early labor.   #Labor: Allow patient to labor on her own, if no progress, can place foley bulb.  #Pain: IV pain medication available.  #FWB: Reassuring Cat 1 #  ID:  GBS UNKNOWN - no prophylaxis needed #MOF: Breast #MOC: Unsure #Circ:  Desire, outpatient  Olivia Canter 12/01/2015, 7:14 AM  OB fellow attestation:  I have seen and examined this patient; I agree with above documentation in the resident's note.   Dana Matthews is a 29 y.o. G1P0 here for PROM  PE: BP 116/63 mmHg  Pulse 71  Temp(Src) 97.6 F (36.4 C) (Oral)  Resp 18  Ht  (1.626 m)  Wt 182 lb (82.555 kg)  BMI 31.22 kg/m2  SpO2 99%  LMP 02/22/2015 Gen: calm comfortable,  NAD Resp: normal effort, no distress Abd: gravid  ROS, labs, PMH reviewed  Plan: Admit for PROM and likely progression through labor Will request prenatal records. Add on infectious titers (Hep B and Rubella) GBS unknown but is term with no risk factors. No need for empiric treatment.  Coping well with labor currently. Epidural when > 5 cm  Federico Flake, MD  Family Medicine, OB Fellow 12/01/2015, 10:11 AM

## 2015-12-01 NOTE — Anesthesia Procedure Notes (Signed)
Epidural Patient location during procedure: OB Start time: 12/01/2015 10:41 AM End time: 12/01/2015 10:48 AM  Staffing Anesthesiologist: Shona SimpsonHOLLIS, Navin Dogan D Performed by: anesthesiologist   Preanesthetic Checklist Completed: patient identified, site marked, surgical consent, pre-op evaluation, timeout performed, IV checked, risks and benefits discussed and monitors and equipment checked  Epidural Patient position: sitting Prep: ChloraPrep Patient monitoring: heart rate, continuous pulse ox and blood pressure Approach: midline Location: L3-L4 Injection technique: LOR saline  Needle:  Needle type: Tuohy  Needle gauge: 17 G Needle length: 9 cm Catheter type: closed end flexible Catheter size: 20 Guage Test dose: negative and 1.5% lidocaine  Assessment Events: blood not aspirated, injection not painful, no injection resistance and no paresthesia  Additional Notes LOR @ 6.5  Patient identified. Risks/Benefits/Options discussed with patient including but not limited to bleeding, infection, nerve damage, paralysis, failed block, incomplete pain control, headache, blood pressure changes, nausea, vomiting, reactions to medications, itching and postpartum back pain. Confirmed with bedside nurse the patient's most recent platelet count. Confirmed with patient that they are not currently taking any anticoagulation, have any bleeding history or any family history of bleeding disorders. Patient expressed understanding and wished to proceed. All questions were answered. Sterile technique was used throughout the entire procedure. Please see nursing notes for vital signs. Test dose was given through epidural catheter and negative prior to continuing to dose epidural or start infusion. Warning signs of high block given to the patient including shortness of breath, tingling/numbness in hands, complete motor block, or any concerning symptoms with instructions to call for help. Patient was given  instructions on fall risk and not to get out of bed. All questions and concerns addressed with instructions to call with any issues or inadequate analgesia.    Reason for block:procedure for pain

## 2015-12-02 ENCOUNTER — Other Ambulatory Visit: Payer: Self-pay

## 2015-12-02 LAB — RUBELLA SCREEN: Rubella: 5.51 index (ref >0.99)

## 2015-12-02 LAB — HEPATITIS B E ANTIGEN: HEP B E AG: NEGATIVE

## 2015-12-02 NOTE — Lactation Note (Signed)
This note was copied from the chart of Dana Matthews. Lactation Consultation Note  Patient Name: Dana Matthews WUJWJ'XToday's Date: 12/02/2015     Maternal Data    Feeding    LATCH Score/Interventions                      Lactation Tools Discussed/Used     Consult Status      Dana Matthews, Dana Matthews 12/02/2015, 5:08 AM

## 2015-12-02 NOTE — Anesthesia Postprocedure Evaluation (Signed)
Anesthesia Post Note  Patient: Dana SongSheila Matthews  Procedure(s) Performed: * No procedures listed *  Patient location during evaluation: Mother Baby Anesthesia Type: Epidural Level of consciousness: awake Pain management: pain level controlled Vital Signs Assessment: post-procedure vital signs reviewed and stable Respiratory status: spontaneous breathing Cardiovascular status: stable Postop Assessment: no headache, no backache, epidural receding, patient able to bend at knees, no signs of nausea or vomiting and adequate PO intake Anesthetic complications: no    Last Vitals:  Filed Vitals:   12/01/15 2100 12/02/15 0105  BP: 108/57 114/55  Pulse: 77 83  Temp: 36.9 C 37.4 C  Resp: 18 20    Last Pain:  Filed Vitals:   12/02/15 0612  PainSc: 7                  Raife Lizer

## 2015-12-02 NOTE — Progress Notes (Signed)
Post Partum Day 1  Subjective:  Dana Matthews is a 29 y.o. G1P1001 10476w3d s/p SVD.  No acute events overnight.  Pt denies problems with ambulating, voiding or po intake.  She denies nausea or vomiting.  Pain is moderately controlled.  She has had flatus. She has not had bowel movement.  Lochia Minimal.  Plan for birth control is LARC.  Method of Feeding: Breast  Objective: BP 114/55 mmHg  Pulse 83  Temp(Src) 99.3 F (37.4 C) (Oral)  Resp 20  Ht 5\' 4"  (1.626 m)  Wt 182 lb (82.555 kg)  BMI 31.22 kg/m2  SpO2 99%  LMP 02/22/2015  Breastfeeding? Unknown  Physical Exam:  General: alert, cooperative and no distress Lochia:normal flow Chest: CTAB Heart: RRR no m/r/g Abdomen: +BS, soft, nontender, fundus firm at/below umbilicus Uterine Fundus: firm,  DVT Evaluation: No evidence of DVT seen on physical exam.    Recent Labs  12/01/15 0655  HGB 11.1*  HCT 32.4*    Assessment/Plan:  ASSESSMENT: Dana Matthews is a 29 y.o. G1P1001 7376w3d ppd #1  s/p NSVD doing well.   Plan for discharge tomorrow   LOS: 1 day   Asiyah Z Mikell 12/02/2015, 7:41 AM

## 2015-12-02 NOTE — Lactation Note (Signed)
This note was copied from the chart of Dana Matthews [place]. Lactation Consultation Note Follow up visit at 28 hours of age.  Mom reports several good feedings with some latch pain.  Mom reports a recent feeding and discussed cluster feeding.  FOB handed mom baby while clothed and encouraged some feedings STS.  Baby latched well with wide flanged lips and strong sucking bursts with stimulation needed.  FOB at bedside supportive.  LC unable to get latch more comfortable, but not nipple trauma noted and nipple mostly round when baby unlatched. Mom to call for assist as needed.   Patient Name: Dana Matthews [place] EAVWU'JToday's Date: 12/02/2015 Reason for consult: Follow-up assessment   Maternal Data    Feeding Feeding Type: Breast Fed Length of feed:  (observed about 8 minutes)  LATCH Score/Interventions Latch: Repeated attempts needed to sustain latch, nipple held in mouth throughout feeding, stimulation needed to elicit sucking reflex. Intervention(s): Adjust position;Assist with latch;Breast massage;Breast compression  Audible Swallowing: A few with stimulation Intervention(s): Skin to skin;Hand expression;Alternate breast massage  Type of Nipple: Everted at rest and after stimulation  Comfort (Breast/Nipple): Filling, red/small blisters or bruises, mild/mod discomfort  Problem noted: Mild/Moderate discomfort Interventions (Mild/moderate discomfort): Hand expression  Hold (Positioning): Assistance needed to correctly position infant at breast and maintain latch. Intervention(s): Breastfeeding basics reviewed;Support Pillows;Position options;Skin to skin  LATCH Score: 6  Lactation Tools Discussed/Used     Consult Status Consult Status: Follow-up Date: 12/03/15 Follow-up type: In-patient    Jannifer RodneyShoptaw, Jana Lynn 12/02/2015, 10:06 PM

## 2015-12-02 NOTE — Clinical Social Work Maternal (Signed)
CLINICAL SOCIAL WORK MATERNAL/CHILD NOTE  Patient Details  Name: Dana Matthews MRN: [id number] Date of Birth: [birth year]  Date:  12/02/2015  Clinical Social Worker Initiating Note:  Loleta BooksSarah Rielynn Trulson MSW, LCSW Date/ Time Initiated:  12/02/15/1400     Child's Name:  [facility]   Legal Guardian:  Mother   Need for Interpreter:  None   Date of Referral:  12/01/15     Reason for Referral:  Current Substance Use/Substance Use During Pregnancy    Referral Source:  [facility]   Address:  [address] [place], [place] [address]  Phone number:  [phone]   Household Members:  Aunt, Aunt's four children   Natural Supports (not living in the home):  Immediate Family, Extended Family   Professional Supports: None   Employment: Unemployed   Type of Work:     Education:      Architectinancial Resources:  [employer]   Other Resources:  [employer], Sales executiveood Stamps    Cultural/Religious Considerations Which May Impact Care:  None reported  Strengths:  Ability to meet basic needs , Home prepared for child    Risk Factors/Current Problems:   1. Substance Use-- History of alcohol abuse.  MOB participated in residential treatment program for 5 months during the pregnancy. MOB denied any use since she initiated treatment.  Infant's toxicology screen are pending.   2. Housing instability: MOB reported housing instability during the pregnancy, but stated that she will be staying with her aunt and her 4 children at discharge.   Cognitive State:  Able to Concentrate , Alert , Goal Oriented , Linear Thinking    Mood/Affect:  Happy , Bright    CSW Assessment:  CSW received request for consult due to MOB presenting with a history of alcohol use, housing instability, and fragmented prenatal care during the pregnancy.  MOB's stepmother was originally present, but left upon CSW arrival.   MOB displayed a bright and cheerful affect during CSW evaluation; however, was a vague and  limited historian.  MOB smiled, laughed, and presented as younger than stated age.  She appeared to be avoidant of answering questions related to mental health and substance use, and shared that everything was "great".  MOB was able to engage in a linear and goal orientated conversation, and was noted to be attending to and attempting to soothe the infant during the assessment.   MOB expressed feelings of happiness and excitement secondary to the infant's birth.  MOB openly discussed labor pains, and shared that she is satisfied with her decision to have an epidural. She stated that she is concerned with the infant's limited interest in feedings, but verbalized understanding that he is exhibiting normative newborn behaviors. MOB expressed interest in continuing to learn how to breastfeed.   MOB stated that she will be living with her aunt and her 4 children at discharge.  Per MOB, she was living with her father and soon to be stepmother when she learned that she was pregnant.  She stated that she and her stepmother have a "love and hate" relationship, and shared that they "fight and make up" on a regular basis. MOB never clarified the events that trigger conflict or disagreement. MOB presented to the ED in April 2016 requesting etoh detox and rehab since she wanted help once she learned that she was pregnant.  CSW noted that MOB was evaluated by ED CSW, and assisted with acceptance and transfer to [facility], a perinatal substance use treatment program.  MOB reported that she  participated in the residential treatment program in [place] for 5 months, and returned to [place] in September.  MOB was vague on details surrounding end of her participation in the program, and she shared that she was discharged earlier than she anticipated/wanted. MOB stated that she then moved to [place] and resided in maternity homes. She stated that she returned to [facility] one month ago since her family had encouraged her to live  nearby so that they can provide her with support.   MOB reported that she is proud of her current sobriety, and stated that she has not had any alcohol since she was in the ED in April.  MOB shared that she has had urges to engage in substance use, but reported that she was able to remain focused on her infant and what was best for him  MOB stated that she has no interest in re-starting use now that the infant has been born since she knows that it would likely trigger additional use.  MOB reported numerous positive changes that occurred when she was no longer drinking, and shared that she has noted no negative outcomes of a result of her sobriety.  MOB denied any current substance use treatment, and denied current need.  MOB shared that she is not sure where she would go for help if needed in the future, but expressed appreciation for CSW offer to provide her with outpatient resources if needs arise postpartum.   CSW informed MOB of high risk for relapse and mental health complications as she transitions postpartum. MOB presented as attentive and engaged as CSW provided education on perinatal mood disorders. MOB denied prior diagnoses of depression and anxiety; however, this strongly contrasts documentation of ED CSW who reported that MOB presents with history of anxiety/depression, and previously coped by using etoh and cigarettes.  MOB continues to voice motivation and intention to not engage in substance use, and shared that she wants to be the best mother she can be.  MOB confirmed that the home is prepared for the infant. She shared that she is unsure who the infant's father is, but denied any safety concerns, stressors, or complications related to the uncertain paternity.   MOB informed of hospital drug screen policy, and denied additional substance use during the pregnancy. MOB verbalized understanding, and denied concerns about infant's pending toxicology screens.   CSW Plan/Description:   1.  Patient/Family Education-- Perinatal mood disorders, hospital drug screen policy  2. CSW to monitor infant's toxicology screens  3. Information/Referral to [employer]: [facility], outpatient substance use resources   4. No Further Intervention Required/No Barriers to Discharge    Kelby Fam 12/02/2015, 3:45 PM

## 2015-12-02 NOTE — Lactation Note (Signed)
This note was copied from the chart of Dana Matthews. Lactation ConsultaMichaelene Songtion Note New mom has ex. Lg. Pendulum breast. Everted nipples. Hand expression taught w/colostrum noted. Baby BF at 2300 and again at 0000 for 15 min. Has been spitty w/large mucous clear emesis. Suction bulb syring shown. Mom encouraged to feed baby 8-12 times/24 hours and with feeding cues. Referred to Baby and Me Book in Breastfeeding section Pg. 22-23 for position options and Proper latch demonstration. Educated about newborn behavior, STS, I&O, cluster feeding, supply and demand. WH/LC brochure given w/resources, support groups and LC services. Patient Name: Dana Michaelene SongSheila Haven ZOXWR'UToday's Date: 12/02/2015     Maternal Data    Feeding    LATCH Score/Interventions                      Lactation Tools Discussed/Used     Consult Status      Larrisha Babineau, Diamond NickelLAURA G 12/02/2015, 5:11 AM

## 2015-12-02 NOTE — Progress Notes (Signed)
CM / UR chart review completed.  

## 2015-12-03 MED ORDER — IBUPROFEN 600 MG PO TABS: 600.0000 mg | ORAL_TABLET | Freq: Four times a day (QID) | ORAL | Status: DC

## 2015-12-03 NOTE — Discharge Instructions (Signed)

## 2015-12-03 NOTE — Lactation Note (Signed)
This note was copied from the chart of Boy Michaelene SongSheila Kadrmas. Lactation Consultation Note  Baby is 40 hours old and at a 8 % weight loss.  Mom and baby were sleeping when I came in.  Mom having a difficult time remembering when last feeding was.  Last documented feeding was 9 hours ago.  Baby undressed and waking techniques needed.  Baby placed skin to skin in football hold.  Mom seems unsure about hand expression.  I demonstrated and one small drop of colostrum obtained.  Baby making attempts at latching but he seems to have difficulty sustaining latch.  20 mm nipple shield applied with instructions on correct application.  Mom indifferent to teaching.  Baby did latch and nurse on both sides with a good amount of colostrum noted in shield.  Stressed importance of feeding baby 8-12 times in 24 hours.  Encouraged frequent attempts this AM.  Will follow up later today.  Patient Name: Boy Michaelene SongSheila Petti ZOXWR'UToday's Date: 12/03/2015 Reason for consult: Follow-up assessment   Maternal Data    Feeding Feeding Type: Breast Fed Length of feed: 15 min  LATCH Score/Interventions Latch: Repeated attempts needed to sustain latch, nipple held in mouth throughout feeding, stimulation needed to elicit sucking reflex. Intervention(s): Adjust position;Assist with latch;Breast massage;Breast compression  Audible Swallowing: A few with stimulation Intervention(s): Skin to skin;Hand expression;Alternate breast massage  Type of Nipple: Everted at rest and after stimulation  Comfort (Breast/Nipple): Soft / non-tender     Hold (Positioning): Assistance needed to correctly position infant at breast and maintain latch. Intervention(s): Breastfeeding basics reviewed;Support Pillows;Skin to skin  LATCH Score: 7  Lactation Tools Discussed/Used     Consult Status      Huston FoleyMOULDEN, Makynzi Eastland S 12/03/2015, 10:19 AM

## 2015-12-03 NOTE — Discharge Summary (Signed)
OB Discharge Summary     Patient Name: Dana Matthews DOB: 08/30/1986 MRN: [id number]  Date of admission: 12/01/2015 Delivering MD: Lyndel SafeNEWTON, KIMBERLY NILES   Date of discharge: 12/03/2015  Admitting diagnosis: 40wks, strong abd cramps ,pain Intrauterine pregnancy: 1658w3d     Secondary diagnosis:  Principal Problem:   NSVD (normal spontaneous vaginal delivery) Active Problems:   Active labor at term   Obstetrical laceration, second degree  Additional problems: none     Discharge diagnosis: Term Pregnancy Delivered                                                                                                Post partum procedures:none  Augmentation: none  Complications: None  Hospital course:  Admitted with rupture of membranes,  Then Onset of Labor With Vaginal Delivery     29 y.o. yo G1P1001 at 1958w3d was admitted in Latent Laboron 12/01/2015. Patient had an uncomplicated labor course as follows:  Membrane Rupture Time/Date: 5:00 AM ,12/01/2015   Intrapartum Procedures: Episiotomy: None [1]                                         Lacerations:  2nd degree [3]  Patient had a delivery of a Viable infant. 12/01/2015  Information for the patient's newborn:  Dana Matthews, Boy Dana Matthews [[id number]]  Delivery Method: Vaginal, Spontaneous Delivery (Filed from Delivery Summary)    [name] had an uncomplicated postpartum course.  She is ambulating, tolerating a regular diet, passing flatus, and urinating well. Patient is discharged home in stable condition on No discharge date for patient encounter.Marland Kitchen.    Physical exam  Filed Vitals:   12/02/15 0105 12/02/15 0845 12/02/15 2007 12/03/15 0555  BP: 114/55 102/44 108/57 98/39  Pulse: 83 71 80 72  Temp: 99.3 F (37.4 C) 98.4 F (36.9 C) 98.4 F (36.9 C) 98 F (36.7 C)  TempSrc: Oral Oral Oral Oral  Resp: 20 16 18 18   Height:      Weight:      SpO2: 99% 99%     General: alert, cooperative and no distress Lochia:  appropriate Uterine Fundus: firm Incision: Healing well with no significant drainage DVT Evaluation: No evidence of DVT seen on physical exam. Labs: Lab Results  Component Value Date   WBC 13.5* 12/01/2015   HGB 11.1* 12/01/2015   HCT 32.4* 12/01/2015   MCV 90.0 12/01/2015   PLT 362 12/01/2015   CMP Latest Ref Rng 04/06/2015  Glucose 70 - 99 mg/dL 782(N114(H)  BUN 6 - 23 mg/dL 6  Creatinine 5.620.50 - 1.301.10 mg/dL 8.650.62  Sodium 784135 - 696145 mmol/L 138  Potassium 3.5 - 5.1 mmol/L 3.8  Chloride 96 - 112 mmol/L 106  CO2 19 - 32 mmol/L 20  Calcium 8.4 - 10.5 mg/dL 9.3  Total Protein 6.0 - 8.3 g/dL 7.1  Total Bilirubin 0.3 - 1.2 mg/dL 0.3  Alkaline Phos 39 - 117 U/L 64  AST 0 - 37 U/L 19  ALT 0 - 35 U/L 13  Discharge instruction: per After Visit Summary and "Baby and Me Booklet".  After visit meds:    Medication List    TAKE these medications        ibuprofen 600 MG tablet  Commonly known as:  ADVIL,MOTRIN  Take 1 tablet (600 mg total) by mouth every 6 (six) hours.     prenatal multivitamin Tabs tablet  Take 1 tablet by mouth daily at 12 noon.        Diet: routine diet  Activity: Advance as tolerated. Pelvic rest for 6 weeks.   Outpatient follow up:6 weeks Follow up Appt:Future Appointments Date Time Provider Department Center  01/13/2016 8:00 AM Levie Heritage, DO [facility]   Follow up Visit:No Follow-up on file.  Postpartum contraception: Undecided but LARC  Newborn Data: Live born female  Birth Weight: 6 lb 11.4 oz (3045 g) APGAR: 9, 9  Baby Feeding: Breast Disposition:home with mother   12/03/2015 Wynelle Bourgeois, CNM

## 2016-01-13 ENCOUNTER — Ambulatory Visit (INDEPENDENT_AMBULATORY_CARE_PROVIDER_SITE_OTHER): Payer: Medicaid Other | Admitting: Family Medicine

## 2016-01-13 ENCOUNTER — Encounter: Payer: Self-pay | Admitting: Family Medicine

## 2016-01-13 NOTE — Progress Notes (Signed)
  Subjective:     Dana Matthews is a 30 y.o. female who presents for a postpartum visit. She is 6 weeks postpartum following a spontaneous vaginal delivery. I have fully reviewed the prenatal and intrapartum course. The delivery was at 40 gestational weeks. Outcome: spontaneous vaginal delivery. Anesthesia: epidural. Postpartum course has been normal. Baby's course has been normal. Baby is feeding by bottle. Bleeding no bleeding. Bowel function is normal. Bladder function is normal. Patient is not sexually active. Contraception method is none. Postpartum depression screening: negative.  The following portions of the patient's history were reviewed and updated as appropriate: allergies, current medications, past family history, past medical history, past social history, past surgical history and problem list.  Review of Systems Pertinent items are noted in HPI.   Objective:    BP 141/71 mmHg  Pulse 60  Temp(Src) 97.9 F (36.6 C) (Oral)  Resp 18  Ht  (1.6 m)  Wt 166 lb 11.2 oz (75.615 kg)  BMI 29.54 kg/m2  SpO2 100%  LMP 01/02/2016  Breastfeeding? No  General:  alert, cooperative and no distress  Lungs: clear to auscultation bilaterally  Heart:  regular rate and rhythm, S1, S2 normal, no murmur, click, rub or gallop  Abdomen: soft, non-tender; bowel sounds normal; no masses,  no organomegaly   Vulva:  normal  Vagina: normal vagina        Assessment:     Normqal postpartum exam. Pap smear not done at today's visit.   Plan:    1. Contraception: IUD - will schedule insertion 2. Follow up in: 1 year or as needed.

## 2016-01-13 NOTE — Patient Instructions (Signed)
Postpartum Depression and Baby Blues °The postpartum period begins right after the birth of a baby. During this time, there is often a great amount of joy and excitement. It is also a time of many changes in the life of the parents. Regardless of how many times a mother gives birth, each child brings new challenges and dynamics to the family. It is not unusual to have feelings of excitement along with confusing shifts in moods, emotions, and thoughts. All mothers are at risk of developing postpartum depression or the "baby blues." These mood changes can occur right after giving birth, or they may occur many months after giving birth. The baby blues or postpartum depression can be mild or severe. Additionally, postpartum depression can go away rather quickly, or it can be a long-term condition.  °CAUSES °Raised hormone levels and the rapid drop in those levels are thought to be a main cause of postpartum depression and the baby blues. A number of hormones change during and after pregnancy. Estrogen and progesterone usually decrease right after the delivery of your baby. The levels of thyroid hormone and various cortisol steroids also rapidly drop. Other factors that play a role in these mood changes include major life events and genetics.  °RISK FACTORS °If you have any of the following risks for the baby blues or postpartum depression, know what symptoms to watch out for during the postpartum period. Risk factors that may increase the likelihood of getting the baby blues or postpartum depression include: °· Having a personal or family history of depression.   °· Having depression while being pregnant.   °· Having premenstrual mood issues or mood issues related to oral contraceptives. °· Having a lot of life stress.   °· Having marital conflict.   °· Lacking a social support network.   °· Having a baby with special needs.   °· Having health problems, such as diabetes.   °SIGNS AND SYMPTOMS °Symptoms of baby blues  include: °· Brief changes in mood, such as going from extreme happiness to sadness. °· Decreased concentration.   °· Difficulty sleeping.   °· Crying spells, tearfulness.   °· Irritability.   °· Anxiety.   °Symptoms of postpartum depression typically begin within the first month after giving birth. These symptoms include: °· Difficulty sleeping or excessive sleepiness.   °· Marked weight loss.   °· Agitation.   °· Feelings of worthlessness.   °· Lack of interest in activity or food.   °Postpartum psychosis is a very serious condition and can be dangerous. Fortunately, it is rare. Displaying any of the following symptoms is cause for immediate medical attention. Symptoms of postpartum psychosis include:  °· Hallucinations and delusions.   °· Bizarre or disorganized behavior.   °· Confusion or disorientation.   °DIAGNOSIS  °A diagnosis is made by an evaluation of your symptoms. There are no medical or lab tests that lead to a diagnosis, but there are various questionnaires that a health care provider may use to identify those with the baby blues, postpartum depression, or psychosis. Often, a screening tool called the Edinburgh Postnatal Depression Scale is used to diagnose depression in the postpartum period.  °TREATMENT °The baby blues usually goes away on its own in 1-2 weeks. Social support is often all that is needed. You will be encouraged to get adequate sleep and rest. Occasionally, you may be given medicines to help you sleep.  °Postpartum depression requires treatment because it can last several months or longer if it is not treated. Treatment may include individual or group therapy, medicine, or both to address any social, physiological, and psychological   factors that may play a role in the depression. Regular exercise, a healthy diet, rest, and social support may also be strongly recommended.  °Postpartum psychosis is more serious and needs treatment right away. Hospitalization is often needed. °HOME CARE  INSTRUCTIONS °· Get as much rest as you can. Nap when the baby sleeps.   °· Exercise regularly. Some women find yoga and walking to be beneficial.   °· Eat a balanced and nourishing diet.   °· Do little things that you enjoy. Have a cup of tea, take a bubble bath, read your favorite magazine, or listen to your favorite music. °· Avoid alcohol.   °· Ask for help with household chores, cooking, grocery shopping, or running errands as needed. Do not try to do everything.   °· Talk to people close to you about how you are feeling. Get support from your partner, family members, friends, or other new moms. °· Try to stay positive in how you think. Think about the things you are grateful for.   °· Do not spend a lot of time alone.   °· Only take over-the-counter or prescription medicine as directed by your health care provider. °· Keep all your postpartum appointments.   °· Let your health care provider know if you have any concerns.   °SEEK MEDICAL CARE IF: °You are having a reaction to or problems with your medicine. °SEEK IMMEDIATE MEDICAL CARE IF: °· You have suicidal feelings.   °· You think you may harm the baby or someone else. °MAKE SURE YOU: °· Understand these instructions. °· Will watch your condition. °· Will get help right away if you are not doing well or get worse. °  °This information is not intended to replace advice given to you by your health care provider. Make sure you discuss any questions you have with your health care provider. °  °Document Released: 08/30/2004 Document Revised: 12/01/2013 Document Reviewed: 09/07/2013 °Elsevier Interactive Patient Education ©2016 [employer] ° °

## 2016-02-03 ENCOUNTER — Ambulatory Visit: Payer: Self-pay | Admitting: Obstetrics & Gynecology

## 2016-02-27 ENCOUNTER — Ambulatory Visit: Payer: Self-pay | Admitting: Obstetrics & Gynecology

## 2017-02-17 ENCOUNTER — Ambulatory Visit (HOSPITAL_COMMUNITY)
Admission: EM | Admit: 2017-02-17 | Discharge: 2017-02-17 | Disposition: A | Discharge status: Home / Self Care | Payer: Medicaid Other | Attending: Family Medicine | Admitting: Family Medicine

## 2017-02-17 ENCOUNTER — Encounter (HOSPITAL_COMMUNITY): Payer: Self-pay | Admitting: *Deleted

## 2017-02-17 DIAGNOSIS — J029 Acute pharyngitis, unspecified: Secondary | ICD-10-CM

## 2017-02-17 DIAGNOSIS — F17210 Nicotine dependence, cigarettes, uncomplicated: Secondary | ICD-10-CM | POA: Diagnosis not present

## 2017-02-17 DIAGNOSIS — J028 Acute pharyngitis due to other specified organisms: Secondary | ICD-10-CM | POA: Insufficient documentation

## 2017-02-17 LAB — POCT RAPID STREP A: STREPTOCOCCUS, GROUP A SCREEN (DIRECT): NEGATIVE

## 2017-02-17 NOTE — ED Triage Notes (Signed)
C/O severe sore throat x 2 wks.  Unsure if fevers.

## 2017-02-17 NOTE — Discharge Instructions (Signed)
He may do honey, salt water gargle, throat lozenges, Chloraseptic spray/lozenges for sore throat. You may also do ibuprofen if you feel like you needs it. We will call you if the culture is positive for strep.

## 2017-02-17 NOTE — ED Provider Notes (Signed)
CSN: [id number]     Arrival date & time 02/17/17  1437 History   First MD Initiated Contact with Patient 02/17/17 1522     Chief Complaint  Patient presents with  . Sore Throat   (Consider location/radiation/quality/duration/timing/severity/associated sxs/prior Treatment) Try over-the-counter throat lozenges without relief. Patient denies fever. Patient also endorses coughing and nasal congestion. Please see below.     Sore Throat  This is a new problem. Episode onset: 2 weeks ago. The problem occurs constantly. The problem has been gradually worsening. Associated symptoms include headaches. Pertinent negatives include no chest pain, no abdominal pain and no shortness of breath. The symptoms are aggravated by swallowing. Nothing relieves the symptoms. The treatment provided no relief.    History reviewed. No pertinent past medical history. Past Surgical History:  Procedure Laterality Date  . NO PAST SURGERIES     No family history on file. Social History  Substance Use Topics  . Smoking status: Current Every Day Smoker    Packs/day: 0.25    Types: Cigarettes  . Smokeless tobacco: Not on file  . Alcohol use No   OB History    Gravida Para Term Preterm AB Living   1 1 1     1    SAB TAB Ectopic Multiple Live Births         0 1     Review of Systems  Respiratory: Negative for shortness of breath.   Cardiovascular: Negative for chest pain.  Gastrointestinal: Negative for abdominal pain.  Neurological: Positive for headaches.    Allergies  Patient has no known allergies.  Home Medications   Prior to Admission medications   Medication Sig Start Date End Date Taking? Authorizing Provider  ibuprofen (ADVIL,MOTRIN) 600 MG tablet Take 1 tablet (600 mg total) by mouth every 6 (six) hours. Patient not taking: Reported on 01/13/2016 12/03/15   Aviva SignsMarie L Williams, CNM  Prenatal Vit-Fe Fumarate-FA (PRENATAL MULTIVITAMIN) TABS tablet Take 1 tablet by mouth daily at 12 noon. Reported  on 01/13/2016    Historical Provider, MD   Meds Ordered and Administered this Visit  Medications - No data to display  BP (!) 140/102   Pulse 91   Temp 98.5 F (36.9 C) (Oral)   Resp 16   LMP 02/03/2017 (Approximate)   SpO2 98%   Breastfeeding? No  No data found.   Physical Exam  Constitutional: She is oriented to person, place, and time. She appears well-developed and well-nourished.  HENT:  Head: Normocephalic and atraumatic.  Right Ear: External ear normal.  Left Ear: External ear normal.  Nose: Nose normal.  Mouth/Throat: Oropharynx is clear and moist. No oropharyngeal exudate.  TM pearly gray with no erythema  Eyes: Conjunctivae are normal. Pupils are equal, round, and reactive to light.  Neck: Normal range of motion. Neck supple.  Cardiovascular: Normal rate, regular rhythm and normal heart sounds.   Pulmonary/Chest: Effort normal and breath sounds normal. No respiratory distress. She has no wheezes.  Abdominal: Soft. Bowel sounds are normal. She exhibits no distension. There is no tenderness.  Lymphadenopathy:    She has no cervical adenopathy.  Neurological: She is alert and oriented to person, place, and time.  Skin: Skin is warm and dry.  Nursing note and vitals reviewed.   Urgent Care Course     Procedures (including critical care time)  Labs Review Labs Reviewed  POCT RAPID STREP A   Imaging Review No results found.  MDM   1. Viral pharyngitis  Strep is negative. Troponin is pending. We will treat symptomatically. Education provided. Home treatment discussed. Return as needed.     Lucia Estelle, NP 02/17/17 1601

## 2017-02-20 LAB — CULTURE, GROUP A STREP (THRC)

## 2018-08-16 ENCOUNTER — Emergency Department (HOSPITAL_COMMUNITY)
Admission: EM | Admit: 2018-08-16 | Discharge: 2018-08-16 | Disposition: A | Discharge status: Home / Self Care | Payer: Self-pay | Attending: Emergency Medicine | Admitting: Emergency Medicine

## 2018-08-16 ENCOUNTER — Encounter (HOSPITAL_COMMUNITY): Payer: Self-pay | Admitting: Emergency Medicine

## 2018-08-16 ENCOUNTER — Emergency Department (HOSPITAL_COMMUNITY): Payer: Self-pay

## 2018-08-16 DIAGNOSIS — Y9289 Other specified places as the place of occurrence of the external cause: Secondary | ICD-10-CM | POA: Insufficient documentation

## 2018-08-16 DIAGNOSIS — S61411A Laceration without foreign body of right hand, initial encounter: Secondary | ICD-10-CM | POA: Insufficient documentation

## 2018-08-16 DIAGNOSIS — W25XXXA Contact with sharp glass, initial encounter: Secondary | ICD-10-CM | POA: Insufficient documentation

## 2018-08-16 DIAGNOSIS — F17210 Nicotine dependence, cigarettes, uncomplicated: Secondary | ICD-10-CM | POA: Insufficient documentation

## 2018-08-16 DIAGNOSIS — Z23 Encounter for immunization: Secondary | ICD-10-CM | POA: Insufficient documentation

## 2018-08-16 DIAGNOSIS — Y9389 Activity, other specified: Secondary | ICD-10-CM | POA: Insufficient documentation

## 2018-08-16 DIAGNOSIS — Y999 Unspecified external cause status: Secondary | ICD-10-CM | POA: Insufficient documentation

## 2018-08-16 MED ORDER — TETANUS-DIPHTH-ACELL PERTUSSIS 5-2.5-18.5 LF-MCG/0.5 IM SUSP
0.5000 mL | Freq: Once | INTRAMUSCULAR | Status: AC
  Administered 2018-08-16: 0.5 mL via INTRAMUSCULAR
  Filled 2018-08-16: qty 0.5

## 2018-08-16 MED ORDER — BUPIVACAINE HCL (PF) 0.5 % IJ SOLN
10.0000 mL | Freq: Once | INTRAMUSCULAR | Status: DC
  Filled 2018-08-16: qty 10

## 2018-08-16 MED ORDER — LIDOCAINE HCL (PF) 1 % IJ SOLN
INTRAMUSCULAR | Status: AC
  Administered 2018-08-16: 30 mL
  Filled 2018-08-16: qty 30

## 2018-08-16 MED ORDER — BUPIVACAINE HCL 0.5 % IJ SOLN
50.0000 mL | Freq: Once | INTRAMUSCULAR | Status: DC
  Filled 2018-08-16 (×2): qty 50

## 2018-08-16 NOTE — Discharge Instructions (Addendum)
Keep wound dry and do not remove dressing for 24 hours if possible. After that, wash gently morning and night (every 12 hours) with soap and water. Use a topical antibiotic ointment and cover with a bandaid or gauze.  °  °Do NOT use rubbing alcohol or hydrogen peroxide, do not soak the area °  °Present to your primary care doctor or the urgent care of your choice, or the ED for suture removal in 10 days. °  °Every attempt was made to remove foreign body (contaminants) from the wound.  However, there is always a chance that some may remain in the wound. This can  increase your risk of infection. °  °If you see signs of infection (warmth, redness, tenderness, pus, sharp increase in pain, fever, red streaking in the skin) immediately return to the emergency department. °  °After the wound heals fully, apply sunscreen for 6-12 months to minimize scarring.  ° °

## 2018-08-16 NOTE — ED Triage Notes (Signed)
Pt arrives with lac to R hand from glass bottle. CMS intact. Needs tdap updated.

## 2018-08-16 NOTE — ED Provider Notes (Signed)
[facility] EMERGENCY DEPARTMENT Provider Note   CSN: [id number] Arrival date & time: 08/16/18  0248     History   Chief Complaint Chief Complaint  Patient presents with  . Laceration    HPI   Blood pressure 128/71, pulse 92, temperature 98.5 F (36.9 C), temperature source Oral, resp. rate 16, height 5\' 3"  (1.6 m), weight 70.3 kg, last menstrual period 07/24/2018, SpO2 97 %.  Dana Matthews is a 32 y.o. female complaining of abrasion to right (dominant hand occurring at 2:15 AM she states that she dropped a bottle, it shattered and it injured the hand.  She denies any numbness, weakness, reduced range of motion, last tetanus shot is unknown.  History reviewed. No pertinent past medical history.  There are no active problems to display for this patient.   Past Surgical History:  Procedure Laterality Date  . NO PAST SURGERIES       OB History    Gravida  1   Para  1   Term  1   Preterm      AB      Living  1     SAB      TAB      Ectopic      Multiple  0   Live Births  1            Home Medications    Prior to Admission medications   Medication Sig Start Date End Date Taking? Authorizing Provider  ibuprofen (ADVIL,MOTRIN) 600 MG tablet Take 1 tablet (600 mg total) by mouth every 6 (six) hours. Patient not taking: Reported on 01/13/2016 12/03/15   Aviva Signs, CNM  Prenatal Vit-Fe Fumarate-FA (PRENATAL MULTIVITAMIN) TABS tablet Take 1 tablet by mouth daily at 12 noon. Reported on 01/13/2016    [provider]    Family History No family history on file.  Social History Social History   Tobacco Use  . Smoking status: Current Every Day Smoker    Packs/day: 0.50    Types: Cigarettes  . Smokeless tobacco: Never Used  Substance Use Topics  . Alcohol use: Yes  . Drug use: No     Allergies   Patient has no known allergies.   Review of Systems Review of Systems  A complete review of systems was  obtained and all systems are negative except as noted in the HPI and PMH.    Physical Exam Updated Vital Signs BP 128/71 (BP Location: Right Arm)   Pulse 92   Temp 98.5 F (36.9 C) (Oral)   Resp 16   Ht 5\' 3"  (1.6 m)   Wt 70.3 kg   LMP 07/24/2018 (Approximate)   SpO2 97%   BMI 27.46 kg/m   Physical Exam  Constitutional: She is oriented to person, place, and time. She appears well-developed and well-nourished. No distress.  HENT:  Head: Normocephalic.  Eyes: Conjunctivae and EOM are normal.  Cardiovascular: Normal rate.  Pulmonary/Chest: Effort normal. No stridor.  Musculoskeletal: Normal range of motion.       Hands: 1 cm full-thickness laceration as diagrammed, no gross contamination.Neurovacularly intact with full ROM and strength to each interphalangeal joint (tested in isolation) in both flexion and extension.    Neurological: She is alert and oriented to person, place, and time.  Psychiatric: She has a normal mood and affect.  Nursing note and vitals reviewed.    ED Treatments / Results  Labs (all labs ordered are listed, but only abnormal results  are displayed) Labs Reviewed - No data to display  EKG None  Radiology Dg Hand Complete Right  Result Date: 08/16/2018 CLINICAL DATA:  Laceration between the first and second metacarpals after cutting hand on a glass bottle. EXAM: RIGHT HAND - COMPLETE 3+ VIEW COMPARISON:  None. FINDINGS: There is no evidence of fracture or dislocation. There is no evidence of arthropathy or other focal bone abnormality. Soft tissues are unremarkable. No radiopaque foreign body identified. IMPRESSION: Negative. Electronically Signed   By: Obie Dredge M.D.   On: 08/16/2018 07:42    Procedures .Marland KitchenLaceration Repair Date/Time: 08/16/2018 8:43 AM Performed by: Wynetta Emery, PA-C Authorized by: Wynetta Emery, PA-C   Consent:    Consent obtained:  Verbal   Consent given by:  Patient   Alternatives discussed:  No treatment and  delayed treatment Laceration details:    Location:  Hand   Hand location:  R hand, dorsum   Length (cm):  1.5 Repair type:    Repair type:  Simple Pre-procedure details:    Preparation:  Patient was prepped and draped in usual sterile fashion Exploration:    Contaminated: no   Treatment:    Area cleansed with:  Saline   Amount of cleaning:  Standard   Irrigation solution:  Sterile saline   Visualized foreign bodies/material removed: no   Skin repair:    Repair method:  Sutures   Suture size:  5-0   Suture technique:  Simple interrupted   Number of sutures:  2 Approximation:    Approximation:  Close Post-procedure details:    Dressing:  Antibiotic ointment   Patient tolerance of procedure:  Tolerated well, no immediate complications   (including critical care time)  Medications Ordered in ED Medications  Tdap (BOOSTRIX) injection 0.5 mL (has no administration in time range)  bupivacaine (MARCAINE) 0.5 % injection 10 mL (has no administration in time range)  lidocaine (PF) (XYLOCAINE) 1 % injection (has no administration in time range)     Initial Impression / Assessment and Plan / ED Course  I have reviewed the triage vital signs and the nursing notes.  Pertinent labs & imaging results that were available during my care of the patient were reviewed by me and considered in my medical decision making (see chart for details).     Vitals:   08/16/18 0249 08/16/18 0250  BP: 128/71   Pulse: 92   Resp: 16   Temp: 98.5 F (36.9 C)   TempSrc: Oral   SpO2: 97%   Weight:  70.3 kg  Height:  5\' 3"  (1.6 m)    Medications  Tdap (BOOSTRIX) injection 0.5 mL (has no administration in time range)  bupivacaine (MARCAINE) 0.5 % injection 10 mL (has no administration in time range)  lidocaine (PF) (XYLOCAINE) 1 % injection (has no administration in time range)    Dana Matthews is 32 y.o. female presenting with hand laceration.  No signs of tendon/joint involvement. Tdap  booster given. Pressure irrigation performed. Laceration occurred < 8 hours prior to repair which was well tolerated. Pt has no co morbidities to effect normal wound healing. Discussed suture home care w pt and answered questions. Pt to f-u for wound check and suture removal in 7 days. Pt is hemodynamically stable with no complaints prior to dc.    Evaluation does not show pathology that would require ongoing emergent intervention or inpatient treatment. Pt is hemodynamically stable and mentating appropriately. Discussed findings and plan with patient/guardian, who agrees with care plan. All  questions answered. Return precautions discussed and outpatient follow up given.      Final Clinical Impressions(s) / ED Diagnoses   Final diagnoses:  Laceration of right hand without foreign body, initial encounter    ED Discharge Orders    None       Chuck Caban, Mardella Layman 08/16/18 0845    Tilden Fossa, MD 08/17/18 0800

## 2019-01-22 ENCOUNTER — Other Ambulatory Visit: Payer: Self-pay

## 2019-01-22 ENCOUNTER — Encounter (HOSPITAL_COMMUNITY): Payer: Self-pay | Admitting: *Deleted

## 2019-01-22 ENCOUNTER — Emergency Department (HOSPITAL_COMMUNITY)
Admission: EM | Admit: 2019-01-22 | Discharge: 2019-01-23 | Disposition: A | Discharge status: Home / Self Care | Payer: Self-pay | Attending: Emergency Medicine | Admitting: Emergency Medicine

## 2019-01-22 DIAGNOSIS — F10929 Alcohol use, unspecified with intoxication, unspecified: Secondary | ICD-10-CM | POA: Insufficient documentation

## 2019-01-22 DIAGNOSIS — Y900 Blood alcohol level of less than 20 mg/100 ml: Secondary | ICD-10-CM | POA: Insufficient documentation

## 2019-01-22 DIAGNOSIS — F10920 Alcohol use, unspecified with intoxication, uncomplicated: Secondary | ICD-10-CM

## 2019-01-22 DIAGNOSIS — F17210 Nicotine dependence, cigarettes, uncomplicated: Secondary | ICD-10-CM | POA: Insufficient documentation

## 2019-01-22 HISTORY — DX: Alcohol abuse, uncomplicated: F10.10

## 2019-01-22 LAB — RAPID URINE DRUG SCREEN, HOSP PERFORMED
AMPHETAMINES: NOT DETECTED
BARBITURATES: NOT DETECTED
BENZODIAZEPINES: NOT DETECTED
Cocaine: NOT DETECTED
Opiates: NOT DETECTED
Tetrahydrocannabinol: NOT DETECTED

## 2019-01-22 LAB — CBG MONITORING, ED: Glucose-Capillary: 85 mg/dL (ref 70–99)

## 2019-01-22 NOTE — ED Provider Notes (Addendum)
[facility]-EMERGENCY DEPT Provider Note   CSN: [id number] Arrival date & time: 01/22/19  2112     History   Chief Complaint Chief Complaint  Patient presents with  . Alcohol Intoxication    HPI Dana Matthews is a 33 y.o. female.  33 y.o female with no PMH presents to the ED brought in by EMS for alcohol intoxication. According to records patient's friends called EMS, patient had a glass of vodka and a beer. When questioned patient she reports "yeah I drink". I have reviewed patient's chart see other visits for alcohol intoxication. When questioned if anything was bother she reports "no I just drink". Unable to obtain further history.      Past Medical History:  Diagnosis Date  . ETOH abuse     There are no active problems to display for this patient.   Past Surgical History:  Procedure Laterality Date  . NO PAST SURGERIES       OB History    Gravida  1   Para  1   Term  1   Preterm      AB      Living  1     SAB      TAB      Ectopic      Multiple  0   Live Births  1            Home Medications    Prior to Admission medications   Not on File    Family History No family history on file.  Social History Social History   Tobacco Use  . Smoking status: Current Every Day Smoker    Packs/day: 0.50    Types: Cigarettes  . Smokeless tobacco: Never Used  Substance Use Topics  . Alcohol use: Yes  . Drug use: No     Allergies   Patient has no known allergies.   Review of Systems Review of Systems  Constitutional: Negative for fever.  Respiratory: Negative for shortness of breath.   Cardiovascular: Negative for chest pain.     Physical Exam Updated Vital Signs BP 119/68 (BP Location: Left Arm)   Pulse 80   Temp 98 F (36.7 C) (Oral)   Resp 18   SpO2 97%   Physical Exam Vitals signs and nursing note reviewed.  Constitutional:      Appearance: Normal appearance.     Comments: Sleeping during  on strecher.  HENT:     Head: Normocephalic and atraumatic.     Mouth/Throat:     Mouth: Mucous membranes are moist.  Neck:     Musculoskeletal: No neck rigidity or muscular tenderness.  Cardiovascular:     Rate and Rhythm: Normal rate.  Pulmonary:     Effort: Pulmonary effort is normal.  Abdominal:     General: There is no distension.     Tenderness: There is no abdominal tenderness.  Skin:    General: Skin is warm and dry.      ED Treatments / Results  Labs (all labs ordered are listed, but only abnormal results are displayed) Labs Reviewed  ETHANOL - Abnormal; Notable for the following components:      Result Value   Alcohol, Ethyl (B) 203 (*)    All other components within normal limits  RAPID URINE DRUG SCREEN, HOSP PERFORMED  CBG MONITORING, ED    EKG None  Radiology No results found.  Procedures Procedures (including critical care time)  Medications Ordered in ED Medications -  No data to display   Initial Impression / Assessment and Plan / ED Course  I have reviewed the triage vital signs and the nursing notes.  Pertinent labs & imaging results that were available during my care of the patient were reviewed by me and considered in my medical decision making (see chart for details).  Clinical Course as of Feb 12 1547  Fri Jan 23, 2019  0026 Plan: Ethanol level pending.  Vital signs have been stable.  Patient will need to metabolize to freedom.   [HM]  0037 elevated  Alcohol, Ethyl (B)(!): 203 [HM]  0409 She is alert, ambulatory and tolerating p.o.  She ambulates to and from the bathroom without assistance and with steady gait.  Vital signs remain within normal limits.   [HM]    Clinical Course User Index [HM] Muthersbaugh, Dahlia ClientHannah, [place]PA-C   Patient arrived via EMS for alcohol intoxication.  Reports she had a drink, according to friends patient had vodka and beer.  During evaluation patient is arousable to sternal rub.  Dr. Matt HolmesBaer has also seen patient, I  have discussed this patient with.  CBG was within normal limits, UDS was negative, ethanol level is currently in process.  She was ambulatory to the bathroom with a steady gait.  [facility] at shift change pending ethanol and MTF.    Final Clinical Impressions(s) / ED Diagnoses   Final diagnoses:  Alcoholic intoxication without complication [facility](HCC)    ED Discharge Orders    None       Claude MangesSoto, Enedina Pair, PA-C 01/23/19 0025    Claude MangesSoto, Tyree Fluharty, PA-C 02/12/19 1548    Sabas SousBero, Michael M, MD 02/12/19 1734

## 2019-01-22 NOTE — ED Notes (Signed)
Bed: [facility] Expected date:  Expected time:  Means of arrival:  Comments: 

## 2019-01-22 NOTE — ED Notes (Signed)
Pt denies SI/HI. Pt admits to vodka and one beer. States she wasn't trying to hurt herself was "just drinking"

## 2019-01-22 NOTE — ED Triage Notes (Addendum)
Friends called EMS due to ETOH, admits to vodka, unknown amt. 123/[id number]% RA CBG 104

## 2019-01-23 LAB — ETHANOL: ALCOHOL ETHYL (B): 203 mg/dL — AB (ref <10)

## 2019-01-23 NOTE — Discharge Instructions (Addendum)
1. Medications: usual home medications 2. Treatment: rest, drink plenty of fluids,  3. Follow Up: Please followup with your primary doctor in 2-3 days for discussion of your diagnoses and further evaluation after today's visit; if you do not have a primary care doctor use the resource guide provided to find one; Please return to the ER for new or worsening symptoms or any other concerns

## 2019-01-23 NOTE — ED Provider Notes (Signed)
Care assumed from [facility], [place] .  Please see her full H&P.  In short,  Dana Matthews is a 33 y.o. female presents for EtOH intoxication.  Per EMS, patient's friends called EMS after she drank a glass of vodka and beer.  Upon initial provider assessment she reports that she did drink.  Patient denies falling and hitting her head to initial provider.  Physical Exam  BP 127/76 (BP Location: Left Arm)   Pulse 82   Temp 98 F (36.7 C) (Oral)   Resp 16   SpO2 100%   Physical Exam Vitals signs and nursing note reviewed.  Constitutional:      General: She is not in acute distress.    Appearance: She is well-developed.  HENT:     Head: Normocephalic.  Eyes:     General: No scleral icterus.    Conjunctiva/sclera: Conjunctivae normal.  Neck:     Musculoskeletal: Normal range of motion.  Cardiovascular:     Rate and Rhythm: Normal rate.  Pulmonary:     Effort: Pulmonary effort is normal.  Musculoskeletal: Normal range of motion.  Skin:    General: Skin is warm and dry.  Neurological:     Mental Status: She is alert.     ED Course/Procedures   Clinical Course as of Jan 23 411  Fri Jan 23, 2019  0026 Plan: Ethanol level pending.  Vital signs have been stable.  Patient will need to metabolize to freedom.   [HM]  0037 elevated  Alcohol, Ethyl (B)(!): 203 [HM]  0409 She is alert, ambulatory and tolerating p.o.  She ambulates to and from the bathroom without assistance and with steady gait.  Vital signs remain within normal limits.   [HM]    Clinical Course User Index [HM] Demarion Pondexter, Boyd Kerbs    Procedures  MDM   Patient presents with alcohol intoxication.  She is now alert and oriented.  She states she drank a lot of vodka tonight that she cannot remember exactly how much.  She reports it is been sometime since she has been drinking like this.  Patient reports she feels tired but is otherwise well.  She has been able to tolerate p.o. without emesis.  She  ambulates to and from the bathroom without assistance and with steady gait.  Will discharge to home.    Alcoholic intoxication without complication [facility])     Chenae Brager, Boyd Kerbs 01/23/19 4076    Gilda Crease, MD 01/23/19 [phone]

## 2019-01-23 NOTE — ED Notes (Signed)
Pt verbalizes understanding of discharge instructions. Topaz pad not available on this computer in the hallway.

## 2019-06-24 IMAGING — DX DG HAND COMPLETE 3+V*R*
3 series · 3 of 3 positions shown · non-contrast
Comparison: None.

CLINICAL DATA: Laceration between the first and second metacarpals
after cutting hand on a glass bottle.

EXAM:
RIGHT HAND - COMPLETE 3+ VIEW

[hand ap]
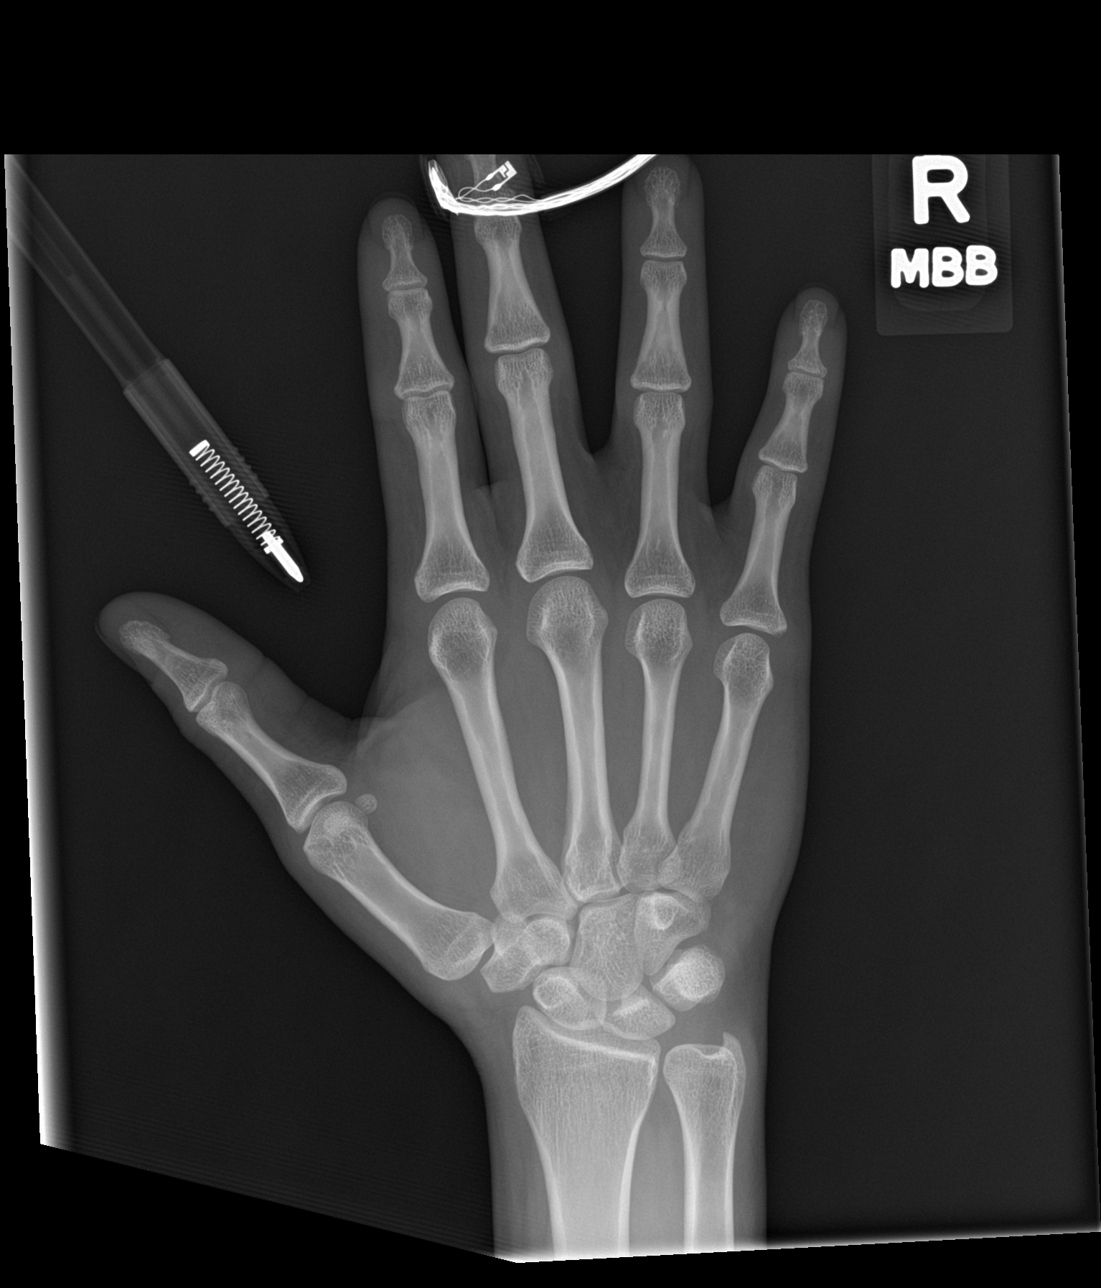

[hand obl]
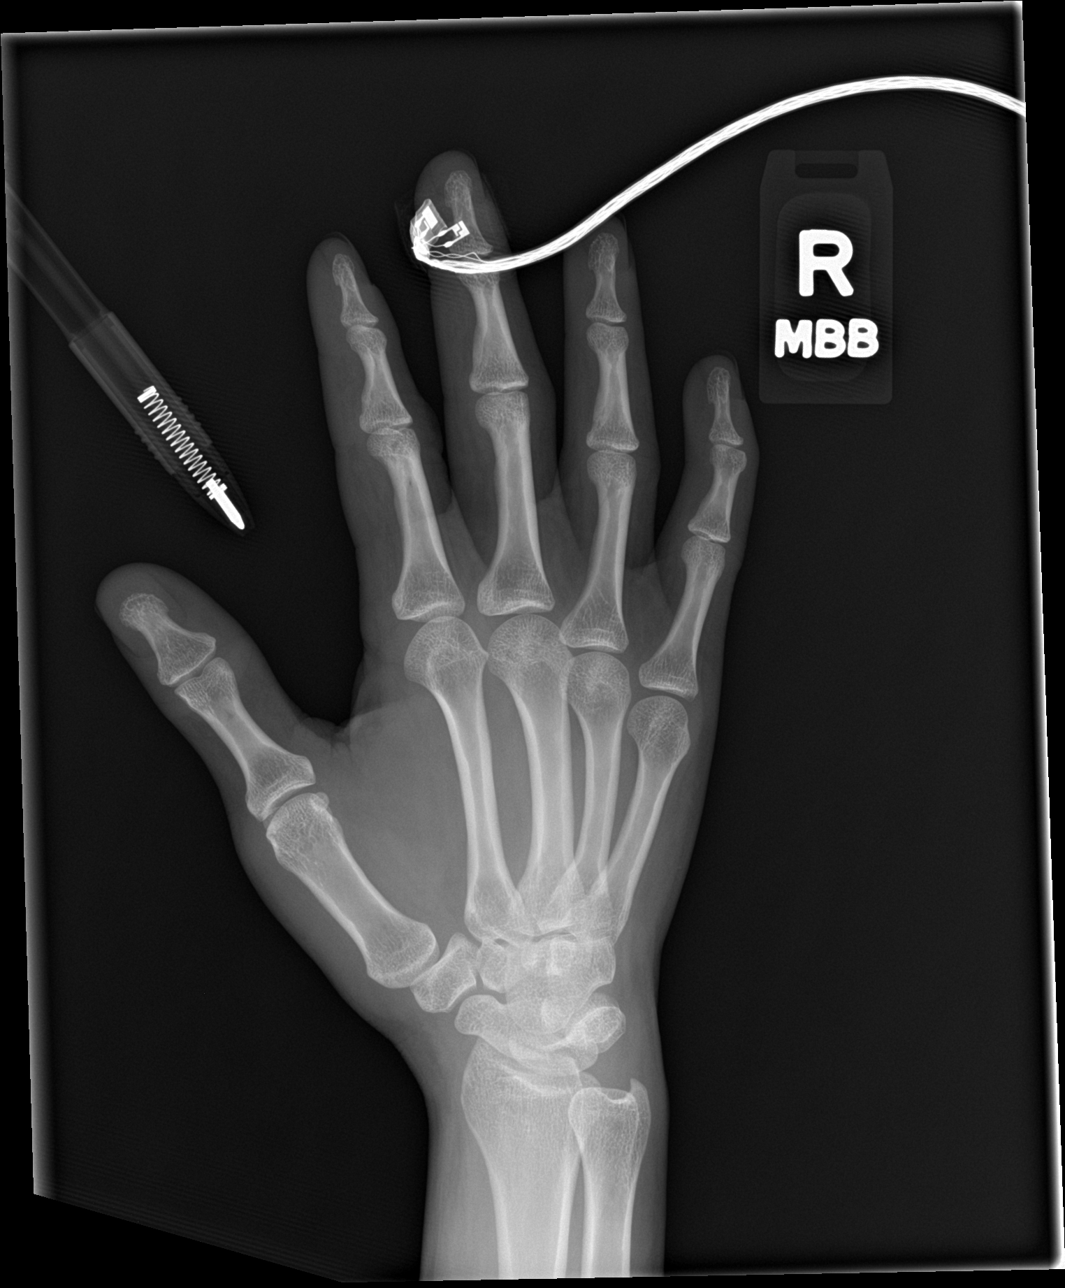

[hand lat]
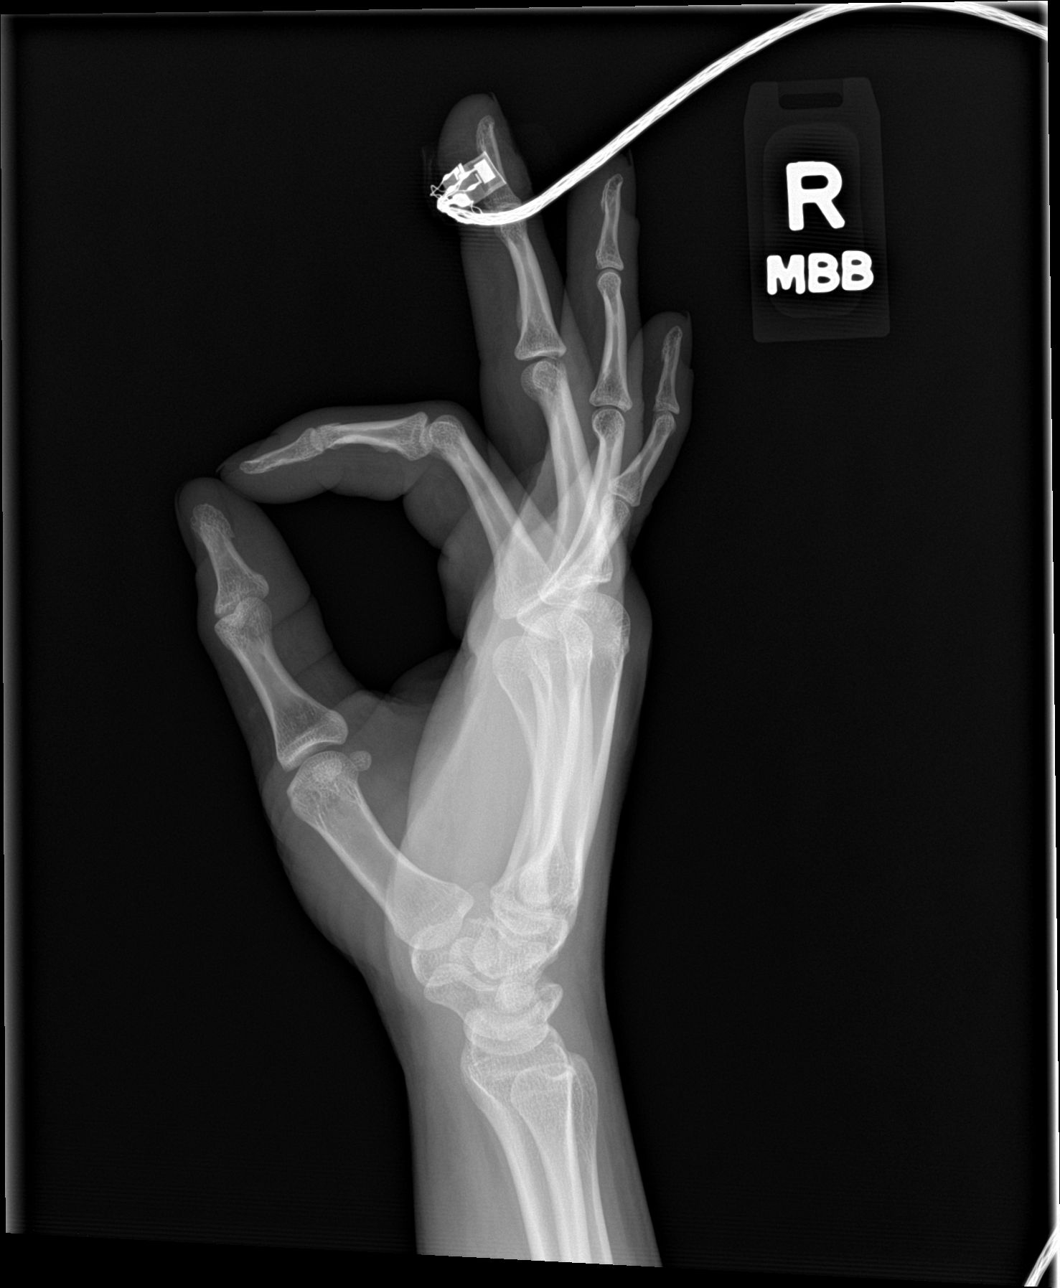

[3 of 3 positions shown; findings below may reference images not displayed]

FINDINGS: There is no evidence of fracture or dislocation. There is no
evidence of arthropathy or other focal bone abnormality. Soft
tissues are unremarkable. No radiopaque foreign body identified.
IMPRESSION: Negative.
